# Patient Record
Sex: Female | Born: 1935 | Race: White | Hispanic: No | Smoking: Never smoker
Health system: Southern US, Community
[De-identification: ages and names within clinical notes are randomized; demographics above are authoritative.]

## PROBLEM LIST (undated history)

## (undated) DIAGNOSIS — R296 Repeated falls: Secondary | ICD-10-CM

## (undated) DIAGNOSIS — I10 Essential (primary) hypertension: Secondary | ICD-10-CM

## (undated) DIAGNOSIS — E038 Other specified hypothyroidism: Secondary | ICD-10-CM

## (undated) DIAGNOSIS — D649 Anemia, unspecified: Secondary | ICD-10-CM

## (undated) DIAGNOSIS — E039 Hypothyroidism, unspecified: Secondary | ICD-10-CM

## (undated) HISTORY — PX: JOINT REPLACEMENT: SHX530

---

## 1998-11-18 ENCOUNTER — Encounter: Payer: Self-pay | Admitting: Orthopaedic Surgery

## 1998-11-22 ENCOUNTER — Inpatient Hospital Stay (HOSPITAL_COMMUNITY): Admission: RE | Admit: 1998-11-22 | Discharge: 1998-11-25 | Payer: Self-pay | Admitting: Orthopaedic Surgery

## 1998-11-25 ENCOUNTER — Encounter: Payer: Self-pay | Admitting: Orthopaedic Surgery

## 1998-11-25 ENCOUNTER — Inpatient Hospital Stay (HOSPITAL_COMMUNITY)
Admission: RE | Admit: 1998-11-25 | Discharge: 1998-12-08 | Payer: Self-pay | Admitting: Physical Medicine & Rehabilitation

## 2001-01-04 ENCOUNTER — Emergency Department (HOSPITAL_COMMUNITY): Admission: EM | Admit: 2001-01-04 | Discharge: 2001-01-05 | Payer: Self-pay | Admitting: Physical Therapy

## 2004-07-25 ENCOUNTER — Inpatient Hospital Stay (HOSPITAL_COMMUNITY): Admission: RE | Admit: 2004-07-25 | Discharge: 2004-07-31 | Payer: Self-pay | Admitting: Orthopaedic Surgery

## 2004-07-25 ENCOUNTER — Ambulatory Visit: Payer: Self-pay | Admitting: Physical Medicine & Rehabilitation

## 2004-12-21 ENCOUNTER — Encounter (INDEPENDENT_AMBULATORY_CARE_PROVIDER_SITE_OTHER): Payer: Self-pay | Admitting: Specialist

## 2004-12-21 ENCOUNTER — Ambulatory Visit (HOSPITAL_COMMUNITY): Admission: RE | Admit: 2004-12-21 | Discharge: 2004-12-21 | Payer: Self-pay | Admitting: Gastroenterology

## 2005-03-22 ENCOUNTER — Encounter (INDEPENDENT_AMBULATORY_CARE_PROVIDER_SITE_OTHER): Payer: Self-pay | Admitting: *Deleted

## 2005-03-22 ENCOUNTER — Ambulatory Visit (HOSPITAL_COMMUNITY): Admission: RE | Admit: 2005-03-22 | Discharge: 2005-03-23 | Payer: Self-pay | Admitting: General Surgery

## 2005-07-06 ENCOUNTER — Ambulatory Visit (HOSPITAL_COMMUNITY): Admission: RE | Admit: 2005-07-06 | Discharge: 2005-07-06 | Payer: Self-pay | Admitting: Gastroenterology

## 2006-04-25 ENCOUNTER — Encounter (INDEPENDENT_AMBULATORY_CARE_PROVIDER_SITE_OTHER): Payer: Self-pay | Admitting: *Deleted

## 2006-04-25 ENCOUNTER — Observation Stay (HOSPITAL_COMMUNITY): Admission: EM | Admit: 2006-04-25 | Discharge: 2006-04-28 | Payer: Self-pay | Admitting: Emergency Medicine

## 2007-09-17 ENCOUNTER — Emergency Department (HOSPITAL_COMMUNITY): Admission: EM | Admit: 2007-09-17 | Discharge: 2007-09-17 | Payer: Self-pay | Admitting: Emergency Medicine

## 2010-08-04 NOTE — Discharge Summary (Signed)
[name], [name]                   ACCOUNT NO.:  [id number]   MEDICAL RECORD NO.:  [id number]          PATIENT TYPE:  INP   LOCATION:  4712                         FACILITY:  [facility]   PHYSICIAN:  Hillery Duke, M.D.   DATE OF BIRTH:  [birth year]   DATE OF ADMISSION:  04/25/2006  DATE OF DISCHARGE:  04/28/2006                               DISCHARGE SUMMARY   PRIMARY CARE PHYSICIAN:  [name], M.D., [facility].   DISCHARGE DIAGNOSES:  1. Weakness and failure to thrive.  2. Allergic reaction, possibly to Lasix, with maculopapular rash and      acute tubular necrosis.  3. Acute renal failure secondary to acute tubular necrosis and      possible dehydration in the setting of ongoing ACE inhibitor and      diuretic use  4. Hypertension.  5. Hypokalemia.  6. History of coronary artery disease.   DISCHARGE MEDICATIONS:  1. Ambien 5 mg nightly p.r.n.  2. Carvedilol 3.125 mg b.i.d.  3. Vytorin 10/20 one tablet daily.  4. aspirin 81 mg daily.  5. Prednisone 30 mg on April 29, 2006, then decrease by 10 mg daily      until off.  6. Pepcid 20 mg b.i.d. x1 week.  7. Benadryl 25 mg q.4 h p.r.n. itching or rash.   Note: The patient was instructed to stop furosemide,  lisinopril/hydrochlorothiazide until she follows up with Dr. Andi Duke for  a check of her renal function before considering resumption of these  medications.  Given the temporal relationship of her allergic reaction  to initiation of therapy with furosemide, she likely has a furosemide  allergy and should not be rechallenged with this medication.   CONSULTATIONS:  None.   BRIEF ADMISSION HISTORY OF PRESENT ILLNESS:  The patient is an 77-year-  old female who had recently been seen by her primary care physician and  put on a 10-day course of treatment with Levaquin for a sinus infection.  She then followed up with her primary care physician and, due to a  finding of lower extremity edema, was prescribed Lasix.  She  took the  first dose on April 24, 2006, and subsequently developed a  maculopapular rash diffusely over her lower extremities and trunk within  24 hours.  The rash was pruritic.  Due to complaints of weakness,  decreased appetite, and rash, she presented to the emergency department  and was admitted for further evaluation and workup.  For the full  details of the HPI, please see the dictated report done by Dr. Corky Duke.   PROCEDURES AND DIAGNOSTIC STUDIES:  Chest x-ray done on April 26, 2006, showed no active disease.  There were some chronic lung markings  but no evidence of active infiltrate, mass, effusion or collapse.   DISCHARGE LABORATORY VALUES:  CBC showed a white blood cell count 5.9,  hemoglobin 11.2, hematocrit 31.9, platelets 192.  Sodium was 139,  potassium 4.4, chloride 109, bicarb 23, BUN 22, creatinine 1.2, glucose  132.   HOSPITAL COURSE:  #1.  WEAKNESS WITH FAILURE TO THRIVE:  The patient  likely is  deconditioned from her recent sinusitis given her advanced age.  She was  seen in consultation with physical and occupational therapists who did  recommend ongoing home physical and occupational therapy.  The patient  will be set up prior to discharge.   #2.  ALLERGIC REACTION, LIKELY TO FUROSEMIDE WITH MACULOPAPULAR RASH:  The patient's rash was resolving.  She was put on a combination of a  prednisone taper, Pepcid, and Benadryl p.r.n.Marland Kitchen  She will complete the  taper of prednisone in approximately 3 days.   #3.  ACUTE RENAL FAILURE SECONDARY TO ACUTE TUBULAR NECROSIS, PRESUMED  DUE TO AN ALLERGIC REACTION TO FUROSEMIDE:  The patient was admitted and  put on IV fluid rehydration.  Her furosemide was discontinued as well as  her lisinopril and hydrochlorothiazide.  With IV fluids and holding  these medications, her creatinine improved from a high on admission of  2.06 to the current value of 1.2.  The patient's baseline creatinine is  unknown.  Given this, we will  continue to hold her furosemide, ACE  inhibitor and diuretic.  She should follow up with Dr. Andi Duke early  next week, and he should repeat a renal function profile, and, if her  renal function is back to baseline, consideration can be made for  resuming her lisinopril and hydrochlorothiazide.  Would not rechallenge  with Lasix given her likely allergy to this medication.  GFR has  increased to 43 mL per minute at discharge.   #4.  HYPERTENSION:  The patient's blood pressure has been fairly well  controlled on Coreg therapy alone.   #5.  HYPOKALEMIA:  The patient was hypokalemic on initial presentation.  She was appropriately repleted.   DISPOSITION:  The patient is now stable for discharge home.  She will be  staying with her grandson for several days until her weakness and  deconditioning have been fully addressed by physical and occupational  therapist.  She should follow up with Dr. Andi Duke early next week and  have a basic metabolic profile done to evaluate for resolution of her  renal failure.      Hillery Duke, M.D.  Electronically Signed     CR/MEDQ  D:  04/28/2006  T:  04/28/2006  Job:  [id number]   cc:   [name], M.D.

## 2010-08-04 NOTE — Op Note (Signed)
[name], [name]                   ACCOUNT NO.:  [id number]   MEDICAL RECORD NO.:  [id number]          PATIENT TYPE:  AMB   LOCATION:  DAY                          FACILITY:  [facility]   PHYSICIAN:  Adolph Pollack, M.D.DATE OF BIRTH:  04/23/1917   DATE OF PROCEDURE:  03/22/2005  DATE OF DISCHARGE:  12/21/2004                                 OPERATIVE REPORT   PREOP DIAGNOSIS:  Large tubulovillous adenoma of the rectum.   POSTOPERATIVE DIAGNOSIS:  Large tubulovillous adenoma of the rectum.   PROCEDURE:  Transanal excision of tubulovillous adenoma of the rectum.   SURGEON:  Adolph Pollack, M.D.   ANESTHESIA:  General.   INDICATIONS:  [name] is an 77 year old female who underwent colonoscopy by  Dr. Elnoria Howard and was found to have a large adenomatous polyp approximately at 8  cm from the anal verge. This was unable to be removed colonoscopically  because of the thick stalk. Biopsy demonstrated tubulovillous adenoma. On  examination, you could barely feel it by way of the tip of the finger. She  now presents for transanal excision. We discussed the procedure and risks  preoperatively.   TECHNIQUE:  She is seen in holding area and brought to the operating room,  placed supine on the operating table. General anesthetic was administered.  She was then carefully placed in the lithotomy position. The perianal area  was sterilely prepped and draped. Digital rectal exam was performed. The  mass was able to be palpated.  I used a 15 cm proctoscope to visualize the  mass; however, the mass was so large it filled the entire lumen with the  proctoscope. I was able to get my fingers around the mass and retract it. I  then provided anterior retraction by way of a Deaver retractor and could  expose the stalk of the polyp. Using the harmonic scalpel,  I then divided  this stalk and excised the polyp. Multiple fragments were also removed  because of some of the traction trauma to the polyp. These  were all placed  in a sterile cup and sent to pathology.   Following this, I inspected the area and there is some bleeding. I  subsequently used and interrupted 2-0 Vicryl suture for hemostasis purposes.  This provided for adequate hemostasis. Following this, I then inserted the  proctosigmoidoscope and went beyond the area of the biopsy and came back  down. Hemostasis was adequate. I then put a large piece of Gelfoam over the  biopsy bed. A bulky dressing was applied.   She tolerated the procedure without any apparent complications. She  subsequent was taken to recovery room in satisfactory condition.      Adolph Pollack, M.D.  Electronically Signed     TJR/MEDQ  D:  03/22/2005  T:  03/22/2005  Job:  [id number]   cc:   Jordan Hawks. Elnoria Howard, MD  Fax: [phone]   Gabriel Earing, M.D.  Fax: [phone]

## 2010-08-04 NOTE — H&P (Signed)
[name], [name]                   ACCOUNT NO.:  [id number]   MEDICAL RECORD NO.:  [id number]          PATIENT TYPE:  EMS   LOCATION:  MAJO                         FACILITY:  [facility]   PHYSICIAN:  Mobolaji B. Bakare, M.D.DATE OF BIRTH:  May 20, 1917   DATE OF ADMISSION:  04/25/2006  DATE OF DISCHARGE:                              HISTORY & PHYSICAL   PRIMARY CARE PHYSICIAN:  Dr. Andi Devon at [facility].   CHIEF COMPLAINT:  Weakness and decreased appetite, rash.   HISTORY OF PRESENT ILLNESS:  [name] is an 77 year old Caucasian female  who developed upper respiratory tract infection with severe sinusitis  about 2 weeks ago.  She was seen at [facility] and started on Levaquin  which she completed for 10 days.  The patient apparently was in contact  with daycare kids and there was upper respiratory tract infection among  the children.  She got better from upper respiratory tract infection  symptoms, but she continued to feel unwell and she has reduced appetite  and she has not been able to eat.  She went back to [employer] 4 days  ago.  The patient had a chest x-ray and some blood work.  She was told  that the chest x-ray was fine and the patient also complained of lower  extremity edema.  She was prescribed Lasix.  She used the Lasix first  dose yesterday.  Subsequently, son who has been the primary caregiver  noticed rash on her lower extremities today.  Rash is associated with  itching and rash has extended to the trunk as well.  There is no fever  or chills.  The patient has nausea but no vomiting.  No diarrhea or  constipation.   REVIEW OF SYSTEMS:  She has headaches, but no change in her vision.  The  patient's appetite is poor.  She denies shortness of breath, but she  coughs and cough is productive of sputum.  No dysuria.  She has stress  incontinence.   PAST MEDICAL HISTORY:  1. Coronary artery disease.  This is unclear, nevertheless, she sees      Vonna Kotyk R. Jacinto Halim, M.D.  as cardiologist.  2. Left total knee replacement.  3. Right hip replacement.   CURRENT MEDICATIONS:  1. Ambien 5 mg q.h.s.  2. Furosemide 20 mg daily which she started yesterday.  3. Lisinopril hydrochlorothiazide 20/12.5 daily.  4. Carvedilol 3.125 b.i.d.  5. Vytorin 10/20 daily.  6. Aspirin 81 mg daily.   ALLERGIES:  PENICILLIN.   SOCIAL HISTORY:  The patient lives independently.  She does not smoke  cigarettes or drink alcohol.   FAMILY HISTORY:  Both parents are deceased from heart trouble.   PHYSICAL EXAMINATION:  VITAL SIGNS:  Temperature 97.1, blood pressure  117/67, pulse 72, respiratory rate 18, O2 saturations 97%.  GENERAL:  The patient is awake, alert, and oriented to time, place, and  person.  HEENT:  Normocephalic and atraumatic head.  Pupils equal, round, and  reactive to light.  Extraocular movements intact.  No elevated JVD.  Mucous membranes dry.  LUNGS:  Bibasilar  rales, no wheeze, and minimal rhonchi.  HEART:  S1 and S2 regular.  ABDOMEN:  Nondistended.  Soft and nontender.  Bowel sounds present.  No  palpable organomegaly.  EXTREMITIES:  Trace ankle edema.  No calf tenderness.  SKIN:  Extensive macular papular rash extending from the feet involving  lower extremities bilaterally, anterior and posterior trunk.  There is  also rash which appears different over the malar region.  NEUROLOGY:  No focal neurological deficit.   LABORATORY DATA:  White count 6.3, hemoglobin 12.5, hematocrit 36.2,  platelets 222, and neutrophils 72%, lymphocytes 15%, eosinophils 3%,  absolute eosinophil count is 0.2 which is within normal limits.  Sodium  136, potassium 3.6, chloride 101, CO2 26, glucose 86, BUN 38, creatinine  2.06, alkaline phosphatase 76, bilirubin 1.0, AST 20, ALT 15, total  protein 6.6, albumin 3.3, calcium 9.9, PT/INR 16.3/1.3.   EKG showed normal sinus rhythm with sinus arrhythmia, normal intervals.   Chest x-ray shows no acute cardiopulmonary  disease.   ASSESSMENT:  [name] is an 77 year old Caucasian female presenting with  recent treatment for urinary tract infection and sinusitis.  She  subsequently developed maculopapular rash and she is noted to have renal  insufficiency on laboratory data.  She is weak.   ADMISSION DIAGNOSIS:  1. Weakness secondary to poor intake and recent infection.  2. Maculopapular rash, extensive.  Probably allergy to antibiotic      (Levaquin versus Lasix).  3. Renal insufficiency.  There is no old BUN and creatinine to      compare.   PLAN:  We will admit to telemetry, cycle cardiac panel q.8 hours x3, and  IV fluid normal saline at 75 mL/hour, Benadryl 12.5 mg q.6 hours p.o.  for 48 hours, Pepcid 20 mg p.o. b.i.d., prednisone 40 mg p.o. daily for  5 days.  I will check fractional excretion of sodium, urinalysis, and  microscopic urine, and eosinophils.  If renal function is not improving,  we will obtain renal ultrasound.  I am holding Lisinopril and  hydrochlorothiazide, also holding Lasix.  We will obtain baseline labs  from Dr. Augustin Schooling office in a.m.      Mobolaji B. Corky Downs, M.D.  Electronically Signed     MBB/MEDQ  D:  04/25/2006  T:  04/25/2006  Job:  [id number]   cc:   Gabriel Earing, M.D.

## 2010-08-04 NOTE — Op Note (Signed)
[name], [name]                   ACCOUNT NO.:  [id number]   MEDICAL RECORD NO.:  [id number]          PATIENT TYPE:  INP   LOCATION:  2899                         FACILITY:  [facility]   PHYSICIAN:  Lubertha Basque. Dalldorf, M.D.DATE OF BIRTH:  January 30, 1918   DATE OF PROCEDURE:  07/25/2004  DATE OF DISCHARGE:                                 OPERATIVE REPORT   PREOPERATIVE DIAGNOSIS:  Right hip degenerative arthritis.   POSTOPERATIVE DIAGNOSIS:  Right hip degenerative arthritis.   PROCEDURE:  Right total hip replacement.   ANESTHESIA:  General.   ATTENDING SURGEON:  Lubertha Basque. Jerl Santos, M.D.   ASSISTANT:  Lindwood Qua, P.A.   INDICATIONS FOR PROCEDURE:  The patient is an 77 year old woman with a long  history of right hip pain. This has persisted despite many different oral  anti-inflammatories. She is ambulatory with a cane and walker and is having  difficulty doing so. She has trouble resting at night. She is offered a hip  replacement. Informed operative consent was obtained after discussion of  possible complications of reaction to anesthesia, infection, DVT, PE,  dislocation, and death.   DESCRIPTION OF PROCEDURE:  The patient is taken to the operating suite where  general anesthetic is applied without difficulty. She was positioned in the  lateral decubitus position with the right hip up. An axillary roll was  placed and all bony prominences were appropriately padded. Hip positioners  were also utilized. She was then prepped and draped in normal sterile  fashion. After administration of preoperative IV antibiotics, a  posterior  approach to the right hip was taken through an abundance of adipose tissue.  All appropriate anti-infective measures were used including closed hooded  exhaust systems for each member the surgical team, Betadine impregnated  drape, and preoperative IV antibiotic. We dissected through about four  inches of adipose tissue to expose the IT band of the hip.  This was then  incised longitudinally along with the fascia of the gluteus maximus to  expose the short external rotators of the hip which were tagged and  reflected. With some moderate difficulty, we released the posterior capsule  and the hip was dislocated. She had a very short femoral neck consistent  with some significant collapse of the head and osteophyte formation. The  femoral neck cut was made just above the lesser trochanter. Femoral head was  removed. This was oval in shape and no longer round. The bone quality of the  acetabulum was good. Some soft tissues including labrum anterior-posterior  was cleared from the acetabulum. Reaming was taken to the medial wall and  then sequentially brought up to size 57 followed by placement of a 58  pinnacle DePuy porous coated cup with one central hole. This was seated well  in appropriate anteversion and tilt. We used the hole eliminator and then a  10 degree [place] lipped liner. Attention was then turned toward the femur.  This was exposed appropriately and reamed and broached up to a size 4. We  decided to place a cemented component in this 77 year old woman with fair to  good bone quality.  A cement spacer was placed distally which was size 3  followed by thorough irrigation of the femoral canal with pulsatile lavage.  This was cleansed thoroughly followed by mixing of cement with antibiotic.  This cement was then pressurized into the femoral stem followed by  introduction of a size 4 high offset [place] cemented stem by DePuy. This was  placed in some anteversion of about 5 or 10 degrees. Excess cement was  trimmed and pressure was held on the component until the cement had  hardened. This was then topped with a +9 32 mm hip ball. The hip reduced  well and was stable in extension with external rotation as well as flexion  with internal rotation. Her leg lengths were judged to be roughly equal. The  wound was irrigated followed by  reapproximation of the short external  rotators to the greater trochanteric region with nonabsorbable suture. IT  band was reapproximated with #1 Vicryl in interrupted fashion. We then  reapproximated the four inches of adipose tissue in three layers with  various sizes of Vicryl. Skin was closed with staples. Some Adaptic was  applied followed by dry gauze and tape. The case was 20 or 30 minutes longer  than normal due to the size of the patient and the thickness of the adipose  layer. Estimated blood loss and intraoperative fluids can be obtained for  anesthesia records.   DISPOSITION:  The patient was extubated in the operating room and taken to  the recovery room in stable addition. Plans were for her to be admitted  orthopedic surgery service for appropriate postop care to include  perioperative antibiotics and Coumadin plus Lovenox for DVT prophylaxis.      PGD/MEDQ  D:  07/25/2004  T:  07/25/2004  Job:  [id number]

## 2010-08-04 NOTE — Discharge Summary (Signed)
[name], [name]                   ACCOUNT NO.:  [id number]   MEDICAL RECORD NO.:  [id number]          PATIENT TYPE:  INP   LOCATION:  5019                         FACILITY:  [facility]   PHYSICIAN:  Lubertha Basque. Dalldorf, M.D.DATE OF BIRTH:  Mar 31, 1917   DATE OF ADMISSION:  07/25/2004  DATE OF DISCHARGE:  07/31/2004                                 DISCHARGE SUMMARY   ADMISSION DIAGNOSES:  1.  Right hip degenerative joint disease.  2.  History of long standing incontinence, intermittent.   DISCHARGE DIAGNOSES:  1.  Right hip degenerative joint disease.  2.  History of long standing incontinence, intermittent.  3.  Coumadin deep vein thrombosis prophylaxis.  4.  Blood loss anemia.   BRIEF HISTORY:  [name] is an 77 year old white female who lives by her  self but has help by a nephew part time at her home.  She is a patient well  known to our practice.  Lives along, independent ambulator.  We have seen  her before for a knee replacement, and she had done well, and now her hip is  very bothersome on the right side.  X-rays show end stage DJD, and she is  having pain with every step and night pain.  I discussed treatment/option  with her, that being a right total knee replacement.  I talked to her about  the risks of anesthesia, DVT and possible death.   LABORATORY AND X-RAY FINDINGS:  Last INR 3.6.  Hemoglobin 9.9, hematocrit  29.2.  Glucose 114, sodium 131, potassium 3.9, creatinine 0.9, BUN 12.  No  active disease on chest x-ray.  Cardiology normal sinus rhythm.   HOSPITAL COURSE:  [name] was admitted postop.  An IV of lactated Ringers,  PCA morphine pump, 1 g of Ancef q.8h. x3 doses, Colace, Trinsicon, laxative  of choice or enema, Coumadin and Lovenox DVT prophylaxis protocol along with  Percocet for pain.  Robaxin is a muscle relaxer and Benadryl to help her  rest.  She was on no home medications.  Ice pack to her right hip.  Incentive spirometry q.1h.  She had a Foley catheter  for two days and then  discontinued.  Bed with an overhead frame.  Knee immobilizer on at nighttime  while resting.  Could be weightbearing as tolerated, CBC, B-met and pro  times as described above.  She did have Autovac drain that was discontinued  postop day #2.  On her postop day #1, her course in hospital blood pressure  was 156/84, heart rate 107, temperature 100.  She was extremely drowsy.  Lungs were clear.  Abdomen was soft.  There was no sign of hip infection or  drainage on the right side.  She had good neurovascular status to her toes.  Foley catheter was in place.  She was using an IV PCA pump.  Hemoglobin 11.9  and INR 1.4.  Physical therapy was ordered for out of bed weightbearing as  tolerated.  Pharmacy helped regulate her Coumadin/Lovenox protocols.  Second  day postop blood pressure remained normal at 137/67, temperature 100.3,  hematocrit 10.1, INR 2.0.  Lungs clear.  Abdomen soft  BUN benign.  Drain  and dressing changed.  No sign of infection or irritation.  Good  neurovascular status to her toes.  Her Foley was discontinued.  Her IV's  were discontinued.  Her PCA was discontinued as well.  She progressed with  physical therapy slowly.  Was very drowsy from her pain medication, and due  to the fact that she lived alone, it was felt that it would be necessary to  have consultations from Rehab and SACU units and they proposed that she be  discharged to an available bed at [facility] skilled nursing facility, and  she was discharged.   CONDITION ON DISCHARGE:  Improved.   DISCHARGE MEDICATIONS:  1  Coumadin protocol for DVT prophylaxis per  pharmacy.  1.  Trinsicon, one capsule per day by mouth.  2.  Senokot tablet as needed p.r.n. constipation.  3.  Enema p.r.n. constipation.  4.  Tylox or Percocet 1-2 q.4-6h. p.r.n. pain.  6  Tylenol 1-2 for temperature elevations above 100.5.  1.  Robaxin 500 mg one p.o. q.8h. p.r.n. spasm.   DISCHARGE INSTRUCTIONS:  1.  Diet:   Unrestricted.  2.  Activities:  Weight bearing as tolerated with help of physical therapy.  3  Wound Care:  May change dressing and dry gauze and tape on a daily basis.  Staples will be removed two weeks from the operative date.   FOLLOWUP:  Follow up with [place] in our office either at the two week mark if  she is still in skilled nursing facility then the one month mark at [phone]  [phone].      MC/MEDQ  D:  07/31/2004  T:  07/31/2004  Job:  [id number]

## 2010-12-14 LAB — BASIC METABOLIC PANEL
BUN: 12
CO2: 26
Chloride: 109
Creatinine, Ser: 1.15
GFR calc Af Amer: 54 — ABNORMAL LOW
Glucose, Bld: 117 — ABNORMAL HIGH

## 2010-12-14 LAB — CBC
MCHC: 34
MCV: 88.2
RBC: 4.56
RDW: 13.4

## 2010-12-14 LAB — DIFFERENTIAL
Basophils Relative: 1
Eosinophils Absolute: 0.2
Monocytes Relative: 9
Neutrophils Relative %: 56

## 2010-12-14 LAB — POCT CARDIAC MARKERS: Operator id: 257131

## 2011-08-05 ENCOUNTER — Ambulatory Visit (INDEPENDENT_AMBULATORY_CARE_PROVIDER_SITE_OTHER): Payer: Medicare Other | Admitting: Family Medicine

## 2011-08-05 VITALS — BP 191/72 | HR 69 | Temp 97.3°F | Resp 18 | Ht 60.5 in | Wt 145.0 lb

## 2011-08-05 DIAGNOSIS — I10 Essential (primary) hypertension: Secondary | ICD-10-CM

## 2011-08-05 DIAGNOSIS — W19XXXA Unspecified fall, initial encounter: Secondary | ICD-10-CM

## 2011-08-05 DIAGNOSIS — L988 Other specified disorders of the skin and subcutaneous tissue: Secondary | ICD-10-CM

## 2011-08-05 DIAGNOSIS — R32 Unspecified urinary incontinence: Secondary | ICD-10-CM

## 2011-08-05 DIAGNOSIS — L989 Disorder of the skin and subcutaneous tissue, unspecified: Secondary | ICD-10-CM

## 2011-08-05 LAB — POCT URINALYSIS DIPSTICK
Nitrite, UA: NEGATIVE
Urobilinogen, UA: 0.2
pH, UA: 7

## 2011-08-05 LAB — POCT CBC
HCT, POC: 40.4 % (ref 37.7–47.9)
Lymph, poc: 2.7 (ref 0.6–3.4)
MCHC: 31.9 g/dL (ref 31.8–35.4)
MCV: 87.7 fL (ref 80–97)
POC Granulocyte: 3.6 (ref 2–6.9)
POC LYMPH PERCENT: 39.2 %L (ref 10–50)
RDW, POC: 14 %

## 2011-08-05 LAB — POCT UA - MICROSCOPIC ONLY
Casts, Ur, LPF, POC: NEGATIVE
Crystals, Ur, HPF, POC: NEGATIVE
Yeast, UA: NEGATIVE

## 2011-08-05 NOTE — Progress Notes (Signed)
Subjective:    Patient ID: [name], female    DOB: [birth year], 78 y.o.   MRN: [id number]  HPI 78 yo female here with falls.  Legs swelling and falls for about 3 months.  Malaise.  Used to go to [facility] [address] but has closed down.   Lives alone.  Brought here by granddaughter-in-law.  Granddaughter states she is calling saying she fell maybe 10 times in last 3-4 months.  Patient stating only twice.  Does tend to be if she trips on something on the floor or on uneven ground.  No injuries from falls. S/p left knee replacement and right hip replacement - 6 yrs ago approx knee, 8 hip. Granddaughter has some concern about memory/mind. Endorses incontinence for several years, worsening recently. Also constipation and then when she does need to go she has to go right away or she won't hold it.  States falls always happened when she didn't have her cane with her.  States doesn't fall if she has her cane.   Has been at least 2 years since she was at the doctor.  Was referred to cards and had thyroid checked.  Put on "tons of meds" and patient took herself off them after about 6 months stating they made her feel badly.    Review of Systems Negative except as per HPI     Objective:   Physical Exam  Constitutional: Vital signs are normal. She appears well-developed and well-nourished. She is active and cooperative.  HENT:  Head: Normocephalic.  Right Ear: Hearing, tympanic membrane, external ear and ear canal normal.  Left Ear: Hearing, tympanic membrane, external ear and ear canal normal.  Nose: Nose normal.  Mouth/Throat: Uvula is midline, oropharynx is clear and moist and mucous membranes are normal.  Eyes: Conjunctivae, EOM and lids are normal. Pupils are equal, round, and reactive to light. Right eye exhibits no nystagmus. Left eye exhibits no nystagmus.  Cardiovascular: Normal rate, regular rhythm, normal heart sounds, intact distal pulses and normal pulses.     Pulmonary/Chest: Effort normal and breath sounds normal.  Neurological: She is alert. She has normal strength. No cranial nerve deficit. She displays a negative Romberg sign. Coordination and gait normal.       Speech is somewhat difficult to understand but patient with poor dentition and granddaughter confirms that is her normal speech.  No marked weakness.  Slow, hunched gait with cane.  Attempted romberg but patient too scared to fully cooperate.   Skin:       Left posterior shoulder - 1 cm dark, hard lesion under thin layer of skin.  Hard. Non tender.  Reportedly removed multiple times before. Came back   Results for orders placed in visit on 08/05/11  POCT CBC      Component Value Range   WBC 6.8  4.6 - 10.2 (K/uL)   Lymph, poc 2.7  0.6 - 3.4    POC LYMPH PERCENT 39.2  10 - 50 (%L)   MID (cbc) 0.5  0 - 0.9    POC MID % 7.3  0 - 12 (%M)   POC Granulocyte 3.6  2 - 6.9    Granulocyte percent 53.5  37 - 80 (%G)   RBC 4.61  4.04 - 5.48 (M/uL)   Hemoglobin 12.9  12.2 - 16.2 (g/dL)   HCT, POC 40.9  81.1 - 47.9 (%)   MCV 87.7  80 - 97 (fL)   MCH, POC 28.0  27 - 31.2 (  pg)   MCHC 31.9  31.8 - 35.4 (g/dL)   RDW, POC 16.1     Platelet Count, POC 339  142 - 424 (K/uL)   MPV 9.0  0 - 99.8 (fL)  POCT UA - MICROSCOPIC ONLY      Component Value Range   WBC, Ur, HPF, POC 1-4     RBC, urine, microscopic neg     Bacteria, U Microscopic trace     Mucus, UA neg     Epithelial cells, urine per micros 0-2     Crystals, Ur, HPF, POC neg     Casts, Ur, LPF, POC neg     Yeast, UA neg    POCT URINALYSIS DIPSTICK      Component Value Range   Color, UA yellow     Clarity, UA clear     Glucose, UA neg     Bilirubin, UA neg     Ketones, UA neg     Spec Grav, UA 1.015     Blood, UA neg     pH, UA 7.0     Protein, UA trace     Urobilinogen, UA 0.2     Nitrite, UA neg     Leukocytes, UA Trace    GLUCOSE, POCT (MANUAL RESULT ENTRY)      Component Value Range   POC Glucose 82  70 - 99 (mg/dl)           Assessment & Plan:  Falls, incontinence, ? Dementia -  Labs today normal. TSH, B12, CMET still pending. DDX: hypothyroid, B12 deficiency, kidney failure, NPH, dementia Await labs.  Treat as indicated.  Consider neuro referral. Following up with me on Thursday to discuss lab results and further tx.  Consider doing MMSE at that visit.   For skin lesion - refer to derm.

## 2011-08-07 LAB — COMPREHENSIVE METABOLIC PANEL
AST: 14 U/L (ref 0–37)
Alkaline Phosphatase: 124 U/L — ABNORMAL HIGH (ref 39–117)
BUN: 23 mg/dL (ref 6–23)
Creat: 1.3 mg/dL — ABNORMAL HIGH (ref 0.50–1.10)

## 2011-08-07 LAB — VITAMIN B12: Vitamin B-12: 339 pg/mL (ref 211–911)

## 2011-08-07 LAB — VITAMIN D 25 HYDROXY (VIT D DEFICIENCY, FRACTURES): Vit D, 25-Hydroxy: 18 ng/mL — ABNORMAL LOW (ref 30–89)

## 2011-08-09 ENCOUNTER — Ambulatory Visit (INDEPENDENT_AMBULATORY_CARE_PROVIDER_SITE_OTHER): Payer: Medicare Other | Admitting: Family Medicine

## 2011-08-09 VITALS — BP 190/90 | HR 72 | Temp 97.7°F | Resp 16 | Ht 59.75 in | Wt 145.8 lb

## 2011-08-09 DIAGNOSIS — I10 Essential (primary) hypertension: Secondary | ICD-10-CM

## 2011-08-09 DIAGNOSIS — W19XXXA Unspecified fall, initial encounter: Secondary | ICD-10-CM

## 2011-08-09 DIAGNOSIS — R269 Unspecified abnormalities of gait and mobility: Secondary | ICD-10-CM

## 2011-08-09 MED ORDER — ERGOCALCIFEROL 1.25 MG (50000 UT) PO CAPS: 50000.0000 [IU] | ORAL_CAPSULE | ORAL | Status: AC

## 2011-08-09 MED ORDER — LISINOPRIL 20 MG PO TABS: 20.0000 mg | ORAL_TABLET | Freq: Every day | ORAL | Status: AC

## 2011-08-09 MED ORDER — AMLODIPINE BESYLATE 10 MG PO TABS: 10.0000 mg | ORAL_TABLET | Freq: Every day | ORAL | Status: AC

## 2011-08-09 NOTE — Progress Notes (Signed)
  Subjective:    Patient ID: [name], female    DOB: March 01, 1918, 78 y.o.   MRN: [id number]  HPI 78 yo female here to follow-up visit from weekend for falls/weakness. Did bring old meds - was on 4 BP meds (coreg, norvasc, lisinopril-hct), pravastatin.   No falls since seen.  See last OV regarding falls. Labs all normal except for vitamin D, which is low, and can contribute to weakness and falls in the elderly    Review of Systems Negative except as per HPI     Objective:   Physical Exam  Constitutional: She appears well-developed and well-nourished.  Cardiovascular: Normal rate, regular rhythm, normal heart sounds and intact distal pulses.   No murmur heard. Pulmonary/Chest: Effort normal and breath sounds normal.  Neurological: She is alert.  Skin: Skin is warm and dry.          Assessment & Plan:  Falls HTN  Question NPH or vascualr dementia =/- age and deconditioning for falls.  Will replete Vit D Restart norvasc and lisinopril.  Avoid HCT to not cause her to hurry to urinate and possibly fall. Neuro referral as family would like to pursue.

## 2011-08-12 ENCOUNTER — Telehealth: Payer: Self-pay

## 2011-08-12 NOTE — Telephone Encounter (Signed)
Pt's Granddaughter Belenda Cruise) is calling to ask which BP Rx she was given when she was here on 08/09/11.  Belenda Cruise states that Dr Georgiana Shore told them to start one BP medicine and then five days later start the other, but she can not remember which on she is suppose to start with.

## 2011-08-12 NOTE — Telephone Encounter (Signed)
Spoke with pt gave instructions for her meds. Pt understood

## 2011-08-12 NOTE — Telephone Encounter (Signed)
Restart lisinopril first, then norvasc

## 2011-08-23 ENCOUNTER — Ambulatory Visit: Payer: Medicare Other | Admitting: Family Medicine

## 2011-08-30 ENCOUNTER — Ambulatory Visit (INDEPENDENT_AMBULATORY_CARE_PROVIDER_SITE_OTHER): Payer: Medicare Other | Admitting: Family Medicine

## 2011-08-30 ENCOUNTER — Encounter: Payer: Self-pay | Admitting: Family Medicine

## 2011-08-30 VITALS — BP 121/68 | HR 67 | Temp 96.8°F | Resp 18 | Ht 59.75 in | Wt 144.8 lb

## 2011-08-30 DIAGNOSIS — Z602 Problems related to living alone: Secondary | ICD-10-CM

## 2011-08-30 DIAGNOSIS — I10 Essential (primary) hypertension: Secondary | ICD-10-CM

## 2011-08-30 DIAGNOSIS — I499 Cardiac arrhythmia, unspecified: Secondary | ICD-10-CM

## 2011-08-30 NOTE — Progress Notes (Signed)
  Subjective:    Patient ID: [name], female    DOB: 01-29-1918, 78 y.o.   MRN: [id number]  HPI  Pt is here with her granddaughter-in-law for follow-up after evaluation at [facility] for falls; she is  awaiting a referral to Neurologist (possible early dementia). Pt ambulates with a cane; has a walker at  home but gets around better with cane. States she has been falling for a long time; she lives alone.  Diet and hydration are fair at best. She may eat 1 good meal a day with a snack ("I just don't have an   appetite") Medication changes were addressed at last visit and pt is taking the 2 meds as Dr. Georgiana Shore advised.   She is a poor historian and is unable to engage in conversation to provide much information. She is  here with her Granddaughter-in-law (also 4 children accompanied pt to this visit).    Review of Systems  Constitutional: Positive for activity change and appetite change. Negative for fever, diaphoresis and fatigue.  Respiratory: Negative.   Cardiovascular: Negative.   Musculoskeletal: Positive for gait problem.  Skin: Negative.   Neurological: Positive for speech difficulty and weakness. Negative for syncope, facial asymmetry, light-headedness and numbness.  Psychiatric/Behavioral: Positive for confusion.       Objective:   Physical Exam  Nursing note and vitals reviewed. Constitutional: No distress.       Appears as frail elderly woman  HENT:  Head: Normocephalic and atraumatic.       Dentition is poor  Eyes: Conjunctivae and EOM are normal. No scleral icterus.  Neck: No thyromegaly present.  Cardiovascular: Normal rate, regular rhythm and normal heart sounds.  Exam reveals no gallop.   No murmur heard. Pulmonary/Chest: Effort normal and breath sounds normal. No respiratory distress. She has no wheezes. She has no rales.  Abdominal: Soft. Bowel sounds are normal. She exhibits no mass. There is no tenderness. There is no guarding.  Musculoskeletal: She exhibits no  edema and no tenderness.       Diffuse degenerative changes in all major joints and in digits  Lymphadenopathy:    She has no cervical adenopathy.  Neurological: She is alert. No cranial nerve deficit. Coordination normal.       Oriented to person (and perhaps place)  Skin: Skin is warm and dry.  Psychiatric: She has a normal mood and affect. Her behavior is normal.       Pt seems mildly confused     ECG:  NSR; no ectopy     Assessment & Plan:   1. Irregular heart rate  EKG 12-Lead Vitamin D supplementation (recheck in 12 weeks)  2. HTN (hypertension), benign - well controlled on current medications   3. Elderly person living alone  Neurology referral is pending

## 2011-09-02 DIAGNOSIS — E559 Vitamin D deficiency, unspecified: Secondary | ICD-10-CM | POA: Insufficient documentation

## 2011-09-02 DIAGNOSIS — I10 Essential (primary) hypertension: Secondary | ICD-10-CM | POA: Insufficient documentation

## 2011-09-02 DIAGNOSIS — Z602 Problems related to living alone: Secondary | ICD-10-CM | POA: Insufficient documentation

## 2011-10-12 ENCOUNTER — Ambulatory Visit: Payer: Self-pay | Admitting: Neurology

## 2012-01-02 ENCOUNTER — Encounter: Payer: Self-pay | Admitting: *Deleted

## 2012-01-02 DIAGNOSIS — L989 Disorder of the skin and subcutaneous tissue, unspecified: Secondary | ICD-10-CM

## 2013-08-25 ENCOUNTER — Other Ambulatory Visit: Payer: Self-pay | Admitting: Family Medicine

## 2013-08-25 ENCOUNTER — Ambulatory Visit (INDEPENDENT_AMBULATORY_CARE_PROVIDER_SITE_OTHER): Payer: Medicare Other | Admitting: Family Medicine

## 2013-08-25 VITALS — BP 144/82 | HR 75 | Temp 97.8°F | Resp 18 | Ht 59.75 in | Wt 124.2 lb

## 2013-08-25 DIAGNOSIS — R42 Dizziness and giddiness: Secondary | ICD-10-CM

## 2013-08-25 DIAGNOSIS — D509 Iron deficiency anemia, unspecified: Secondary | ICD-10-CM

## 2013-08-25 DIAGNOSIS — E038 Other specified hypothyroidism: Secondary | ICD-10-CM

## 2013-08-25 DIAGNOSIS — R32 Unspecified urinary incontinence: Secondary | ICD-10-CM

## 2013-08-25 DIAGNOSIS — R296 Repeated falls: Secondary | ICD-10-CM

## 2013-08-25 DIAGNOSIS — R5381 Other malaise: Secondary | ICD-10-CM

## 2013-08-25 DIAGNOSIS — E559 Vitamin D deficiency, unspecified: Secondary | ICD-10-CM

## 2013-08-25 DIAGNOSIS — Z602 Problems related to living alone: Secondary | ICD-10-CM

## 2013-08-25 DIAGNOSIS — E039 Hypothyroidism, unspecified: Secondary | ICD-10-CM

## 2013-08-25 DIAGNOSIS — Z9181 History of falling: Secondary | ICD-10-CM

## 2013-08-25 DIAGNOSIS — R630 Anorexia: Secondary | ICD-10-CM

## 2013-08-25 LAB — POCT URINALYSIS DIPSTICK
BILIRUBIN UA: NEGATIVE
GLUCOSE UA: NEGATIVE
Ketones, UA: NEGATIVE
NITRITE UA: POSITIVE
Protein, UA: 30
Spec Grav, UA: 1.02
Urobilinogen, UA: 0.2
pH, UA: 5.5

## 2013-08-25 LAB — COMPREHENSIVE METABOLIC PANEL
ALBUMIN: 3.8 g/dL (ref 3.5–5.2)
AST: 13 U/L (ref 0–37)
Alkaline Phosphatase: 110 U/L (ref 39–117)
BUN: 18 mg/dL (ref 6–23)
CALCIUM: 10.3 mg/dL (ref 8.4–10.5)
CHLORIDE: 103 meq/L (ref 96–112)
CO2: 24 mEq/L (ref 19–32)
Creat: 1.2 mg/dL — ABNORMAL HIGH (ref 0.50–1.10)
Glucose, Bld: 86 mg/dL (ref 70–99)
POTASSIUM: 4.4 meq/L (ref 3.5–5.3)
SODIUM: 136 meq/L (ref 135–145)
TOTAL PROTEIN: 7.2 g/dL (ref 6.0–8.3)
Total Bilirubin: 0.4 mg/dL (ref 0.2–1.2)

## 2013-08-25 LAB — POCT UA - MICROSCOPIC ONLY
Casts, Ur, LPF, POC: NEGATIVE
Crystals, Ur, HPF, POC: NEGATIVE
MUCUS UA: POSITIVE
YEAST UA: NEGATIVE

## 2013-08-25 LAB — CBC
HCT: 33.8 % — ABNORMAL LOW (ref 36.0–46.0)
Hemoglobin: 11.3 g/dL — ABNORMAL LOW (ref 12.0–15.0)
MCH: 26.6 pg (ref 26.0–34.0)
MCHC: 33.4 g/dL (ref 30.0–36.0)
MCV: 79.5 fL (ref 78.0–100.0)
PLATELETS: 411 10*3/uL — AB (ref 150–400)
RBC: 4.25 MIL/uL (ref 3.87–5.11)
RDW: 14.3 % (ref 11.5–15.5)
WBC: 9.1 10*3/uL (ref 4.0–10.5)

## 2013-08-25 NOTE — Progress Notes (Addendum)
Subjective:   This chart was scribed for Norberto Sorenson, MD, by Yevette Edwards, ED Scribe. This patient's care was started at 3:48 PM.    Patient ID: [name], female    DOB: 07-Jun-1917, 80 y.o.   MRN: [id number]  Chief Complaint  Patient presents with  . Difficulty Walking    having prob with walking x 3 weeks    HPI  HPI Comments: [name] is a 80 y.o. female who presents to [facility] complaining of increased difficulty ambulating. She denies pain to her lower extremities. She experiences baseline swelling to her lower extremities; the pt reports the swelling makes her feel "heavy" and she has difficulty walking. She also falls because she is off-balance; she has not fallen recently, but she is concerned that she will fall. She also endorses dizziness.   The pt lives independently at home. She uses a cane and walker for ambulation; she states she uses the walker more often now. Her previous falls were associated with using a cane.  She has neighbors who check on her, and her grandson checks on her daily.  She has not engaged in physical therapy. She used to wear compression hose, but she does not anymore as they are painful.   [name] has a h/o Lt hip surgery and Rt knee surgery performed by Dr. Yisroel Ramming at [facility]. 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 He gave her cortisone IM which improved the pain for several months, but now the knee pain is beginning to return.   [name] also reports a decreased appetite. Her grandson states she is improved with Ensure, but she does not drink it regularly. The pt states she goes 7-8 hours without eating. She had a slice of cake for breakfast this morning.   Additionally, she reports episodes of urinary and bowel incontinence.   Her grandson reports a cyst to her left shoulder which itches. The pt had the cyst removed twice in the past. She  endorses itching associated with the cyst, but she denies pain. [name] does not have a dermatologist.    History reviewed. No pertinent past medical history.  Current Outpatient Prescriptions on File Prior to Visit  Medication Sig Dispense Refill  . amLODipine (NORVASC) 10 MG tablet Take 1 tablet (10 mg total) by mouth daily.  90 tablet  3  . lisinopril (PRINIVIL,ZESTRIL) 20 MG tablet Take 1 tablet (20 mg total) by mouth daily.  90 tablet  3   No current facility-administered medications on file prior to visit.   Allergies  Allergen Reactions  . Penicillins     Review of Systems  Constitutional: Positive for appetite change. Negative for activity change, fatigue and unexpected weight change.  HENT: Positive for dental problem.   Cardiovascular: Positive for leg swelling.  Gastrointestinal: Positive for diarrhea.  Genitourinary: Positive for urgency and enuresis. Negative for dysuria and difficulty urinating.  Musculoskeletal: Positive for arthralgias, back pain, gait problem and joint swelling. Negative for myalgias.  Skin: Positive for color change. Negative for rash and wound.       Cyst   Neurological: Positive for dizziness, weakness and light-headedness. Negative for facial asymmetry.  Hematological: Negative for adenopathy. Bruises/bleeds easily.  Psychiatric/Behavioral: Positive for behavioral problems. Negative for confusion, self-injury and dysphoric mood. The patient is not nervous/anxious.       BP 144/82  Pulse  75  Temp(Src) 97.8 F (36.6 C) (Oral)  Resp 18  Ht 4' 11.75" (1.518 m)  Wt 124 lb 3.2 oz (56.337 kg)  BMI 24.45 kg/m2  SpO2 98% Objective:   Physical Exam  Nursing note and vitals reviewed. Constitutional: She is oriented to person, place, and time. She appears well-developed and well-nourished. No distress.  HENT:  Head: Normocephalic and atraumatic.  Poor dentition.   Eyes: Conjunctivae and EOM are normal.  Neck: Normal range of motion. No  tracheal deviation present.  Cardiovascular: Normal rate and intact distal pulses.   Pulmonary/Chest: Effort normal. No respiratory distress.  Musculoskeletal: Normal range of motion. She exhibits edema and tenderness.  2+ Dorsalis Pedis pulses bilaterally.  Trace bilateral pitting edema. Moderate varicose veins.  Moderate tenderness to palpation. Normal cap refill.  Feet and toenails normal.   Neurological: She is alert and oriented to person, place, and time.  She had a negative Rhomberg with cane. But was unable to perform pronator drift while standing due to ataxia. Gait is slowed and shuffling with pt frequently clutching cane, wall, and furniture for support.   Skin: Skin is warm and dry.  1 inch black nodular firm protrusion partially covered by thin epidermis. Fully mobile and nonadherent to subcutaneous tissue. No erythematous bleeding or invasive aspect seen.   Psychiatric: She has a normal mood and affect. Her behavior is normal.      Assessment & Plan:  Advised pt to use her walk or a 4 point cane at all times when ambulating.  Elderly person living alone - Plan: Comprehensive metabolic panel, Vit D  25 hydroxy (rtn osteoporosis monitoring), CBC, TSH, Vitamin B12, Ambulatory referral to Home Health  Debility - Plan: Comprehensive metabolic panel, Vit D  25 hydroxy (rtn osteoporosis monitoring), CBC, TSH, Vitamin B12, Ambulatory referral to Home Health  Falls frequently - Plan: POCT UA - Microscopic Only, POCT urinalysis dipstick, Comprehensive metabolic panel, CBC, TSH, Vitamin B12, Ambulatory referral to Home Health- referred for home health PT due to imbalance, safety assessment of home, and need for strengthening.  Paper Rx given for four-point cane - use that or walker at ALL times.  Urinary incontinence - Plan: POCT UA - Microscopic Only, POCT urinalysis dipstick  Decreased appetite - Plan: POCT UA - Microscopic Only, POCT urinalysis dipstick, Comprehensive metabolic  panel, Vit D  25 hydroxy (rtn osteoporosis monitoring), CBC, TSH, Vitamin B12 - family wants a protein like pill they could give her as pt often will just eat cake/cookies.  Paper Rx given for protein supplement like boost or ensure - pt is going to require enteral formula in order to sustain ADLs due to poor balanced nutrition intake, decreased appetite, decreased protein consumption that is worsening debilities.  Lightheadedness - Plan: POCT UA - Microscopic Only, POCT urinalysis dipstick, Comprehensive metabolic panel, Vit D  25 hydroxy (rtn osteoporosis monitoring), CBC, TSH, Vitamin B12 - pt refused orthostatic VS today due to time limitations.  I do think it may be worth trying to wean pt off BP meds - amlodipine could be exacerbating lower ext edema which could help as well as slightly higher BPs ok due to age as BP more likely to be labile.  Over 40 minutes spend in face-to-face eval w/ pt and coordinating pt care.  I personally performed the services described in this documentation, which was scribed in my presence. The recorded information has been reviewed and considered, and addended by me as needed.  Norberto Sorenson, MD MPH  ADDENDUM:  Vitamin D deficiency: start once weekly supplement x 6 mos  New hypothyroidism - TSH mildly elevated but free t3 low so start levothyroxine 50 mcg - f/u in 2 mos for recheck while still on the medication.  Iron deficiency anemia - needs home hemosure, start daily iron supp. Recheck in 2 mos.

## 2013-08-25 NOTE — Patient Instructions (Signed)
Fall Prevention and Home Safety Falls cause injuries and can affect all age groups. It is possible to use preventive measures to significantly decrease the likelihood of falls. There are many simple measures which can make your home safer and prevent falls. OUTDOORS  Repair cracks and edges of walkways and driveways.  Remove high doorway thresholds.  Trim shrubbery on the main path into your home.  Have good outside lighting.  Clear walkways of tools, rocks, debris, and clutter.  Check that handrails are not broken and are securely fastened. Both sides of steps should have handrails.  Have leaves, snow, and ice cleared regularly.  Use sand or salt on walkways during winter months.  In the garage, clean up grease or oil spills. BATHROOM  Install night lights.  Install grab bars by the toilet and in the tub and shower.  Use non-skid mats or decals in the tub or shower.  Place a plastic non-slip stool in the shower to sit on, if needed.  Keep floors dry and clean up all water on the floor immediately.  Remove soap buildup in the tub or shower on a regular basis.  Secure bath mats with non-slip, double-sided rug tape.  Remove throw rugs and tripping hazards from the floors. BEDROOMS  Install night lights.  Make sure a bedside light is easy to reach.  Do not use oversized bedding.  Keep a telephone by your bedside.  Have a firm chair with side arms to use for getting dressed.  Remove throw rugs and tripping hazards from the floor. KITCHEN  Keep handles on pots and pans turned toward the center of the stove. Use back burners when possible.  Clean up spills quickly and allow time for drying.  Avoid walking on wet floors.  Avoid hot utensils and knives.  Position shelves so they are not too high or low.  Place commonly used objects within easy reach.  If necessary, use a sturdy step stool with a grab bar when reaching.  Keep electrical cables out of the  way.  Do not use floor polish or wax that makes floors slippery. If you must use wax, use non-skid floor wax.  Remove throw rugs and tripping hazards from the floor. STAIRWAYS  Never leave objects on stairs.  Place handrails on both sides of stairways and use them. Fix any loose handrails. Make sure handrails on both sides of the stairways are as long as the stairs.  Check carpeting to make sure it is firmly attached along stairs. Make repairs to worn or loose carpet promptly.  Avoid placing throw rugs at the top or bottom of stairways, or properly secure the rug with carpet tape to prevent slippage. Get rid of throw rugs, if possible.  Have an electrician put in a light switch at the top and bottom of the stairs. OTHER FALL PREVENTION TIPS  Wear low-heel or rubber-soled shoes that are supportive and fit well. Wear closed toe shoes.  When using a stepladder, make sure it is fully opened and both spreaders are firmly locked. Do not climb a closed stepladder.  Add color or contrast paint or tape to grab bars and handrails in your home. Place contrasting color strips on first and last steps.  Learn and use mobility aids as needed. Install an electrical emergency response system.  Turn on lights to avoid dark areas. Replace light bulbs that burn out immediately. Get light switches that glow.  Arrange furniture to create clear pathways. Keep furniture in the same place.    Firmly attach carpet with non-skid or double-sided tape.  Eliminate uneven floor surfaces.  Select a carpet pattern that does not visually hide the edge of steps.  Be aware of all pets. OTHER HOME SAFETY TIPS  Set the water temperature for 120 F (48.8 C).  Keep emergency numbers on or near the telephone.  Keep smoke detectors on every level of the home and near sleeping areas. Document Released: 02/23/2002 Document Revised: 09/04/2011 Document Reviewed: 05/25/2011 [facility] Patient Information 2014  [place], [place].  High Protein Diet A high protein diet means that high protein foods are added to your diet. Getting more protein in the diet is important for a number of reasons. Protein helps the body to build tissue, muscle, and to repair damage. People who have had surgery, injuries such as broken bones, infections, and burns, or illnesses such as cancer, may need more protein in their diet.  SERVING SIZES Measuring foods and serving sizes helps to make sure you are getting the right amount of food. The list below tells how big or small some common serving sizes are.   1 oz.........4 stacked dice.  3 oz........Marland KitchenDeck of cards.  1 tsp.......Marland KitchenTip of little finger.  1 tbs......Marland KitchenMarland KitchenThumb.  2 tbs.......Marland KitchenGolf ball.   cup......Marland KitchenHalf of a fist.  1 cup.......Marland KitchenA fist. FOOD SOURCES OF PROTEIN Listed below are some food sources of protein and the amount of protein they contain. Your Registered Dietitian can calculate how many grams of protein you need for your medical condition. High protein foods can be added to the diet at mealtime or as snacks. Be sure to have at least 1 protein-containing food at each meal and snack to ensure adequate intake.  Meats and Meat Substitutes / Protein (g)  3 oz poultry (chicken, [place]) / 26 g  3 oz tuna, canned in water / 26 g  3 oz fish (cod) / 21 g  3 oz red meat (beef, pork) / 21 g  4 oz tofu / 9 g  1 egg / 6 g   cup egg substitute / 5 g  1 cup dried beans / 15 g  1 cup soy milk / 4 g Dairy / Protein (g)  1 cup milk (skim, 1%, 2%, whole) / 8 g   cup evaporated milk / 9 g  1 cup buttermilk / 8 g  1 cup low-fat plain yogurt / 11 g  1 cup regular plain yogurt / 9 g   cup cottage cheese / 14 g  1 oz cheddar cheese / 7 g Nuts / Protein (g)  2 tbs peanut butter / 8 g  1 oz peanuts / 7 g  2 tbs cashews / 5 g  2 tbs almonds / 5 g Document Released: 03/05/2005 Document Revised: 05/28/2011 Document Reviewed: 12/06/2006 [employer]  Patient Information 2014 [place], [place].

## 2013-08-26 LAB — TSH: TSH: 4.792 u[IU]/mL — AB (ref 0.350–4.500)

## 2013-08-26 LAB — VITAMIN B12: Vitamin B-12: 436 pg/mL (ref 211–911)

## 2013-08-26 LAB — VITAMIN D 25 HYDROXY (VIT D DEFICIENCY, FRACTURES): Vit D, 25-Hydroxy: 16 ng/mL — ABNORMAL LOW (ref 30–89)

## 2013-08-27 ENCOUNTER — Encounter: Payer: Self-pay | Admitting: Family Medicine

## 2013-08-27 DIAGNOSIS — R296 Repeated falls: Secondary | ICD-10-CM | POA: Insufficient documentation

## 2013-08-27 DIAGNOSIS — R5381 Other malaise: Secondary | ICD-10-CM | POA: Insufficient documentation

## 2013-08-27 LAB — IRON: Iron: 21 ug/dL — ABNORMAL LOW (ref 42–145)

## 2013-08-27 LAB — T4, FREE: FREE T4: 0.99 ng/dL (ref 0.80–1.80)

## 2013-08-27 LAB — FERRITIN: Ferritin: 22 ng/mL (ref 10–291)

## 2013-08-27 LAB — T3, FREE: T3, Free: 2 pg/mL — ABNORMAL LOW (ref 2.3–4.2)

## 2013-08-27 MED ORDER — ERGOCALCIFEROL 1.25 MG (50000 UT) PO CAPS: 50000.0000 [IU] | ORAL_CAPSULE | ORAL | Status: AC

## 2013-08-27 NOTE — Addendum Note (Signed)
Addended by: Norberto Sorenson on: 08/27/2013 06:00 AM   Modules accepted: Orders

## 2013-08-30 ENCOUNTER — Encounter: Payer: Self-pay | Admitting: Family Medicine

## 2013-08-30 DIAGNOSIS — E039 Hypothyroidism, unspecified: Secondary | ICD-10-CM | POA: Insufficient documentation

## 2013-08-30 DIAGNOSIS — E038 Other specified hypothyroidism: Secondary | ICD-10-CM | POA: Insufficient documentation

## 2013-08-30 DIAGNOSIS — D509 Iron deficiency anemia, unspecified: Secondary | ICD-10-CM | POA: Insufficient documentation

## 2013-08-30 MED ORDER — LEVOTHYROXINE SODIUM 50 MCG PO TABS: 50.0000 ug | ORAL_TABLET | Freq: Every day | ORAL | Status: DC

## 2013-08-30 MED ORDER — POLYSACCHARIDE IRON COMPLEX 150 MG PO CAPS: 150.0000 mg | ORAL_CAPSULE | Freq: Every day | ORAL | Status: AC

## 2013-08-30 NOTE — Addendum Note (Signed)
Addended by: Norberto SorensonSHAW, Marshell Dilauro on: 08/30/2013 09:13 AM   Modules accepted: Orders

## 2013-10-07 ENCOUNTER — Telehealth: Payer: Self-pay

## 2013-10-07 NOTE — Telephone Encounter (Signed)
REQUESTING LAST OV FOR CONTINUING CARE   --SHARON  [phone]  FAX    DONE

## 2014-01-16 ENCOUNTER — Other Ambulatory Visit: Payer: Self-pay | Admitting: Family Medicine

## 2014-02-04 ENCOUNTER — Emergency Department (HOSPITAL_COMMUNITY): Payer: Medicare Other

## 2014-02-04 ENCOUNTER — Inpatient Hospital Stay (HOSPITAL_COMMUNITY)
Admission: EM | Admit: 2014-02-04 | Discharge: 2014-02-16 | DRG: 871 | Disposition: E | Payer: Medicare Other | Attending: Pulmonary Disease | Admitting: Pulmonary Disease

## 2014-02-04 ENCOUNTER — Encounter (HOSPITAL_COMMUNITY): Payer: Self-pay | Admitting: Adult Health

## 2014-02-04 DIAGNOSIS — D649 Anemia, unspecified: Secondary | ICD-10-CM | POA: Diagnosis present

## 2014-02-04 DIAGNOSIS — N179 Acute kidney failure, unspecified: Secondary | ICD-10-CM | POA: Diagnosis present

## 2014-02-04 DIAGNOSIS — J690 Pneumonitis due to inhalation of food and vomit: Secondary | ICD-10-CM | POA: Diagnosis present

## 2014-02-04 DIAGNOSIS — R296 Repeated falls: Secondary | ICD-10-CM | POA: Diagnosis present

## 2014-02-04 DIAGNOSIS — R6521 Severe sepsis with septic shock: Secondary | ICD-10-CM | POA: Diagnosis not present

## 2014-02-04 DIAGNOSIS — R001 Bradycardia, unspecified: Secondary | ICD-10-CM | POA: Diagnosis present

## 2014-02-04 DIAGNOSIS — J9601 Acute respiratory failure with hypoxia: Secondary | ICD-10-CM

## 2014-02-04 DIAGNOSIS — F17220 Nicotine dependence, chewing tobacco, uncomplicated: Secondary | ICD-10-CM | POA: Diagnosis present

## 2014-02-04 DIAGNOSIS — Z978 Presence of other specified devices: Secondary | ICD-10-CM

## 2014-02-04 DIAGNOSIS — Z66 Do not resuscitate: Secondary | ICD-10-CM | POA: Diagnosis present

## 2014-02-04 DIAGNOSIS — D72829 Elevated white blood cell count, unspecified: Secondary | ICD-10-CM

## 2014-02-04 DIAGNOSIS — R4182 Altered mental status, unspecified: Secondary | ICD-10-CM | POA: Diagnosis present

## 2014-02-04 DIAGNOSIS — J9690 Respiratory failure, unspecified, unspecified whether with hypoxia or hypercapnia: Secondary | ICD-10-CM | POA: Diagnosis present

## 2014-02-04 DIAGNOSIS — T68XXXA Hypothermia, initial encounter: Secondary | ICD-10-CM | POA: Diagnosis not present

## 2014-02-04 DIAGNOSIS — I10 Essential (primary) hypertension: Secondary | ICD-10-CM | POA: Diagnosis present

## 2014-02-04 DIAGNOSIS — I469 Cardiac arrest, cause unspecified: Secondary | ICD-10-CM

## 2014-02-04 DIAGNOSIS — N390 Urinary tract infection, site not specified: Secondary | ICD-10-CM | POA: Diagnosis present

## 2014-02-04 DIAGNOSIS — E039 Hypothyroidism, unspecified: Secondary | ICD-10-CM | POA: Diagnosis present

## 2014-02-04 DIAGNOSIS — R579 Shock, unspecified: Secondary | ICD-10-CM

## 2014-02-04 DIAGNOSIS — X31XXXA Exposure to excessive natural cold, initial encounter: Secondary | ICD-10-CM | POA: Diagnosis present

## 2014-02-04 DIAGNOSIS — A419 Sepsis, unspecified organism: Secondary | ICD-10-CM | POA: Diagnosis present

## 2014-02-04 DIAGNOSIS — K922 Gastrointestinal hemorrhage, unspecified: Secondary | ICD-10-CM | POA: Diagnosis present

## 2014-02-04 DIAGNOSIS — E872 Acidosis: Secondary | ICD-10-CM | POA: Diagnosis present

## 2014-02-04 DIAGNOSIS — Z515 Encounter for palliative care: Secondary | ICD-10-CM | POA: Diagnosis not present

## 2014-02-04 DIAGNOSIS — Z8249 Family history of ischemic heart disease and other diseases of the circulatory system: Secondary | ICD-10-CM | POA: Diagnosis not present

## 2014-02-04 DIAGNOSIS — Z880 Allergy status to penicillin: Secondary | ICD-10-CM | POA: Diagnosis not present

## 2014-02-04 HISTORY — DX: Essential (primary) hypertension: I10

## 2014-02-04 HISTORY — DX: Anemia, unspecified: D64.9

## 2014-02-04 HISTORY — DX: Other specified hypothyroidism: E03.8

## 2014-02-04 HISTORY — DX: Hypothyroidism, unspecified: E03.9

## 2014-02-04 HISTORY — DX: Repeated falls: R29.6

## 2014-02-04 LAB — URINALYSIS, ROUTINE W REFLEX MICROSCOPIC
GLUCOSE, UA: NEGATIVE mg/dL
Ketones, ur: 15 mg/dL — AB
Nitrite: NEGATIVE
Protein, ur: 30 mg/dL — AB
SPECIFIC GRAVITY, URINE: 1.013 (ref 1.005–1.030)
Urobilinogen, UA: 0.2 mg/dL (ref 0.0–1.0)
pH: 5 (ref 5.0–8.0)

## 2014-02-04 LAB — I-STAT ARTERIAL BLOOD GAS, ED
ACID-BASE DEFICIT: 14 mmol/L — AB (ref 0.0–2.0)
Acid-base deficit: 18 mmol/L — ABNORMAL HIGH (ref 0.0–2.0)
BICARBONATE: 12.7 meq/L — AB (ref 20.0–24.0)
Bicarbonate: 15.4 mEq/L — ABNORMAL LOW (ref 20.0–24.0)
O2 SAT: 100 %
O2 Saturation: 86 %
PCO2 ART: 54.1 mmHg — AB (ref 35.0–45.0)
PH ART: 6.979 — AB (ref 7.350–7.450)
PO2 ART: 409 mmHg — AB (ref 80.0–100.0)
TCO2: 14 mmol/L (ref 0–100)
TCO2: 17 mmol/L (ref 0–100)
pCO2 arterial: 51.1 mmHg — ABNORMAL HIGH (ref 35.0–45.0)
pH, Arterial: 7.088 — CL (ref 7.350–7.450)
pO2, Arterial: 71 mmHg — ABNORMAL LOW (ref 80.0–100.0)

## 2014-02-04 LAB — CBC WITH DIFFERENTIAL/PLATELET
Basophils Absolute: 0 10*3/uL (ref 0.0–0.1)
Basophils Relative: 0 % (ref 0–1)
EOS ABS: 0 10*3/uL (ref 0.0–0.7)
Eosinophils Relative: 0 % (ref 0–5)
HEMATOCRIT: 35.5 % — AB (ref 36.0–46.0)
HEMOGLOBIN: 11.3 g/dL — AB (ref 12.0–15.0)
Lymphocytes Relative: 12 % (ref 12–46)
Lymphs Abs: 2 10*3/uL (ref 0.7–4.0)
MCH: 25.5 pg — AB (ref 26.0–34.0)
MCHC: 31.8 g/dL (ref 30.0–36.0)
MCV: 80.1 fL (ref 78.0–100.0)
MONO ABS: 2 10*3/uL — AB (ref 0.1–1.0)
MONOS PCT: 12 % (ref 3–12)
Neutro Abs: 13.2 10*3/uL — ABNORMAL HIGH (ref 1.7–7.7)
Neutrophils Relative %: 76 % (ref 43–77)
Platelets: 318 10*3/uL (ref 150–400)
RBC: 4.43 MIL/uL (ref 3.87–5.11)
RDW: 16 % — ABNORMAL HIGH (ref 11.5–15.5)
WBC: 17.2 10*3/uL — ABNORMAL HIGH (ref 4.0–10.5)

## 2014-02-04 LAB — COMPREHENSIVE METABOLIC PANEL
ALT: 22 U/L (ref 0–35)
AST: 64 U/L — AB (ref 0–37)
Albumin: 2.7 g/dL — ABNORMAL LOW (ref 3.5–5.2)
Alkaline Phosphatase: 101 U/L (ref 39–117)
Anion gap: 27 — ABNORMAL HIGH (ref 5–15)
BUN: 66 mg/dL — ABNORMAL HIGH (ref 6–23)
CO2: 13 mEq/L — ABNORMAL LOW (ref 19–32)
Calcium: 9.3 mg/dL (ref 8.4–10.5)
Chloride: 103 mEq/L (ref 96–112)
Creatinine, Ser: 1.68 mg/dL — ABNORMAL HIGH (ref 0.50–1.10)
GFR calc Af Amer: 29 mL/min — ABNORMAL LOW (ref >90)
GFR calc non Af Amer: 25 mL/min — ABNORMAL LOW (ref >90)
GLUCOSE: 104 mg/dL — AB (ref 70–99)
POTASSIUM: 4.4 meq/L (ref 3.7–5.3)
SODIUM: 143 meq/L (ref 137–147)
TOTAL PROTEIN: 5.8 g/dL — AB (ref 6.0–8.3)
Total Bilirubin: 0.4 mg/dL (ref 0.3–1.2)

## 2014-02-04 LAB — I-STAT CHEM 8, ED
BUN: 65 mg/dL — ABNORMAL HIGH (ref 6–23)
CALCIUM ION: 1.21 mmol/L (ref 1.13–1.30)
CHLORIDE: 110 meq/L (ref 96–112)
Creatinine, Ser: 1.9 mg/dL — ABNORMAL HIGH (ref 0.50–1.10)
Glucose, Bld: 104 mg/dL — ABNORMAL HIGH (ref 70–99)
HEMATOCRIT: 38 % (ref 36.0–46.0)
HEMOGLOBIN: 12.9 g/dL (ref 12.0–15.0)
Potassium: 4.1 mEq/L (ref 3.7–5.3)
Sodium: 140 mEq/L (ref 137–147)
TCO2: 16 mmol/L (ref 0–100)

## 2014-02-04 LAB — URINE MICROSCOPIC-ADD ON

## 2014-02-04 LAB — MRSA PCR SCREENING: MRSA by PCR: NEGATIVE

## 2014-02-04 LAB — I-STAT TROPONIN, ED: Troponin i, poc: 0.72 ng/mL (ref 0.00–0.08)

## 2014-02-04 LAB — GLUCOSE, CAPILLARY: Glucose-Capillary: 108 mg/dL — ABNORMAL HIGH (ref 70–99)

## 2014-02-04 LAB — CK TOTAL AND CKMB (NOT AT ARMC)
CK TOTAL: 1237 U/L — AB (ref 7–177)
CK, MB: 66.2 ng/mL (ref 0.3–4.0)
Relative Index: 5.4 — ABNORMAL HIGH (ref 0.0–2.5)

## 2014-02-04 MED ORDER — SODIUM CHLORIDE 0.9 % IV SOLN
INTRAVENOUS | Status: DC
  Administered 2014-02-04: 15:00:00 via INTRAVENOUS

## 2014-02-04 MED ORDER — NOREPINEPHRINE BITARTRATE 1 MG/ML IV SOLN
2.0000 ug/min | INTRAVENOUS | Status: DC
  Administered 2014-02-04: 50 ug/min via INTRAVENOUS
  Filled 2014-02-04: qty 16

## 2014-02-04 MED ORDER — SODIUM BICARBONATE 8.4 % IV SOLN
50.0000 meq | Freq: Once | INTRAVENOUS | Status: AC
  Administered 2014-02-04: 50 meq via INTRAVENOUS
  Filled 2014-02-04 (×2): qty 50

## 2014-02-04 MED ORDER — SODIUM CHLORIDE 0.9 % IV SOLN: 250.0000 mL | INTRAVENOUS | Status: DC | PRN

## 2014-02-04 MED ORDER — FENTANYL CITRATE 0.05 MG/ML IJ SOLN: 50.0000 ug | INTRAMUSCULAR | Status: DC | PRN

## 2014-02-04 MED ORDER — MORPHINE BOLUS VIA INFUSION
5.0000 mg | INTRAVENOUS | Status: DC | PRN
  Filled 2014-02-04: qty 20

## 2014-02-04 MED ORDER — VANCOMYCIN HCL IN DEXTROSE 750-5 MG/150ML-% IV SOLN
750.0000 mg | INTRAVENOUS | Status: DC
Start: 2014-02-06 — End: 2014-02-04

## 2014-02-04 MED ORDER — SODIUM CHLORIDE 0.9 % IV SOLN
1000.0000 mL | Freq: Once | INTRAVENOUS | Status: AC
  Administered 2014-02-04: 1000 mL via INTRAVENOUS

## 2014-02-04 MED ORDER — MIDAZOLAM HCL 2 MG/2ML IJ SOLN
INTRAMUSCULAR | Status: AC
  Filled 2014-02-04: qty 2

## 2014-02-04 MED ORDER — MIDAZOLAM HCL 5 MG/5ML IJ SOLN
INTRAMUSCULAR | Status: AC | PRN
  Administered 2014-02-04: 2 mg via INTRAVENOUS

## 2014-02-04 MED ORDER — IPRATROPIUM-ALBUTEROL 0.5-2.5 (3) MG/3ML IN SOLN
3.0000 mL | RESPIRATORY_TRACT | Status: DC
  Filled 2014-02-04: qty 3

## 2014-02-04 MED ORDER — NOREPINEPHRINE BITARTRATE 1 MG/ML IV SOLN
2.0000 ug/min | INTRAVENOUS | Status: DC
  Administered 2014-02-04: 32 ug/min via INTRAVENOUS
  Filled 2014-02-04: qty 4

## 2014-02-04 MED ORDER — SODIUM CHLORIDE 0.9 % IV BOLUS (SEPSIS)
1000.0000 mL | Freq: Once | INTRAVENOUS | Status: AC
  Administered 2014-02-04: 1000 mL via INTRAVENOUS

## 2014-02-04 MED ORDER — DEXTROSE 5 % IV SOLN
0.0000 ug/min | Freq: Once | INTRAVENOUS | Status: AC
  Administered 2014-02-04: 20 ug/min via INTRAVENOUS
  Filled 2014-02-04: qty 4

## 2014-02-04 MED ORDER — DEXTROSE 5 % IV SOLN
0.0000 ug/min | Freq: Once | INTRAVENOUS | Status: DC
  Filled 2014-02-04: qty 4

## 2014-02-04 MED ORDER — ETOMIDATE 2 MG/ML IV SOLN: 10.0000 mg | Freq: Once | INTRAVENOUS | Status: DC

## 2014-02-04 MED ORDER — EPINEPHRINE HCL 0.1 MG/ML IJ SOSY
PREFILLED_SYRINGE | INTRAMUSCULAR | Status: AC | PRN
  Administered 2014-02-04: 1 mg via INTRAVENOUS

## 2014-02-04 MED ORDER — MORPHINE SULFATE 10 MG/ML IJ SOLN
10.0000 mg/h | INTRAMUSCULAR | Status: DC
  Administered 2014-02-04: 1 mg/h via INTRAVENOUS
  Filled 2014-02-04: qty 10

## 2014-02-04 MED ORDER — SODIUM CHLORIDE 0.9 % IV SOLN
1000.0000 mL | INTRAVENOUS | Status: DC
  Administered 2014-02-04: 1000 mL via INTRAVENOUS

## 2014-02-04 MED ORDER — HEPARIN (PORCINE) IN NACL 100-0.45 UNIT/ML-% IJ SOLN
650.0000 [IU]/h | INTRAMUSCULAR | Status: DC
  Administered 2014-02-04: 650 [IU]/h via INTRAVENOUS
  Filled 2014-02-04 (×2): qty 250

## 2014-02-04 MED ORDER — PANTOPRAZOLE SODIUM 40 MG IV SOLR: 40.0000 mg | Freq: Every day | INTRAVENOUS | Status: DC

## 2014-02-04 MED ORDER — SODIUM CHLORIDE 0.9 % IV BOLUS (SEPSIS): 500.0000 mL | INTRAVENOUS | Status: DC | PRN

## 2014-02-04 MED ORDER — VANCOMYCIN HCL IN DEXTROSE 1-5 GM/200ML-% IV SOLN
1000.0000 mg | Freq: Once | INTRAVENOUS | Status: DC
  Filled 2014-02-04: qty 200

## 2014-02-04 MED ORDER — SODIUM CHLORIDE 0.9 % IV SOLN
1000.0000 mL | Freq: Once | INTRAVENOUS | Status: AC
Start: 2014-02-04 — End: 2014-02-04
  Administered 2014-02-04: 1000 mL via INTRAVENOUS

## 2014-02-04 MED ORDER — PIPERACILLIN-TAZOBACTAM IN DEX 2-0.25 GM/50ML IV SOLN
2.2500 g | Freq: Three times a day (TID) | INTRAVENOUS | Status: DC
  Administered 2014-02-04: 2.25 g via INTRAVENOUS
  Filled 2014-02-04 (×4): qty 50

## 2014-02-04 MED ORDER — MIDAZOLAM HCL 2 MG/2ML IJ SOLN: 1.0000 mg | INTRAMUSCULAR | Status: DC | PRN

## 2014-02-04 MED ORDER — ALBUTEROL SULFATE (2.5 MG/3ML) 0.083% IN NEBU: 2.5000 mg | INHALATION_SOLUTION | RESPIRATORY_TRACT | Status: DC | PRN

## 2014-02-04 MED FILL — Medication: Qty: 1 | Status: AC

## 2014-02-07 ENCOUNTER — Telehealth: Payer: Self-pay | Admitting: Family Medicine

## 2014-02-07 NOTE — Telephone Encounter (Signed)
[facility] stating that patient died on Thursday 2013/06/24. They need a death certificate signed by Dr. Clelia CroftShaw and they plan to bring that by on Monday 02/08/2014 during her office hours. This is just an [employer] message.  Thanks, [employer]

## 2014-02-08 NOTE — Telephone Encounter (Signed)
Death certificate in Dr. Alver FisherShaw's box.

## 2014-02-08 NOTE — Telephone Encounter (Signed)
[facility] put in pick up drawer. Courier will be picking up.

## 2014-02-08 NOTE — Telephone Encounter (Signed)
Looks like pt passed away in the hosp - life support was w/d while pt was in ICU under Dr. Leland HerYacoub Lebaeur [facility]. Please have them complete certificate.  I only saw this pt once. thanks

## 2014-02-13 NOTE — Discharge Summary (Signed)
NAMGillis Santa:  [place], Zeidy                   ACCOUNT NO.:  [id number]  MEDICAL RECORD NO.:  [id number]  LOCATION:  2M10C                        FACILITY:  [facility]  PHYSICIAN:  Felipa EvenerWesam Jake Jhanae Jaskowiak, MD  DATE OF BIRTH:  Jul 25, 1917  DATE OF ADMISSION:  Aug 11, 2013 DATE OF DISCHARGE:  Aug 11, 2013                              DISCHARGE SUMMARY   DEATH SUMMARY:  PRIMARY DIAGNOSIS/CAUSE OF DEATH:  Pulseless electrical activity and cardiac arrest.  SECONDARY DIAGNOSES:  Respiratory failure, hypotension, hypothermia, hypoxemia, respiratory acidosis, septic shock, acute renal failure, metabolic acidosis, gastrointestinal bleeding.  The patient is a 80 year old female with a extensive past medical history, who was last seen normal 2 days prior to presentation who was found outside her home hypothermic and unresponsive, brought to the emergency department.  The patient lost pulse briefly in the emergency department and CPR was performed.  Return of spontaneous circulation established.  The patient was __________ continued to be unresponsive. The patient's family arrived and upon discussion with the family __________the patient would have never wanted to be on life support, and __________ on multiple occasions and given poor prognosis of her current condition, decision was made to make the patient comfort care.  Morphine was started, withdrawal order replaced.  Awaiting family's arrival in order to proceed with extubation.  The patient was extubated and expired comfortably with family at bedside.     Felipa EvenerWesam Jake Mitsy Owen, MD     WJY/MEDQ  D:  02/12/2014  T:  02/13/2014  Job:  [id number]

## 2014-02-16 NOTE — Code Documentation (Signed)
EDP at bedside to place central line 

## 2014-02-16 NOTE — Progress Notes (Signed)
Patient compassionately extubated at this time.  Patient has morphine gtt infusing at this time for air hunger.  Family at bedside.  Will continue to monitor.

## 2014-02-16 NOTE — Code Documentation (Signed)
Pt cleaned on vomit and stool

## 2014-02-16 NOTE — Code Documentation (Signed)
Pt placed on zoll pads and monitor.  

## 2014-02-16 NOTE — Progress Notes (Signed)
  Echocardiogram 2D Echocardiogram has been performed.  [name] 09-28-2013, 3:20 PM

## 2014-02-16 NOTE — Procedures (Signed)
Extubation Procedure Note  Patient Details:   Name: [name] DOB: 05/25/1917 MRN: [id number]   Airway Documentation:  Airway 7.5 mm (Active)  Secured at (cm) 21 cm 12/09/2013  2:45 PM  Measured From Lips 12/09/2013  2:45 PM  Secured Location Left 12/09/2013  2:45 PM  Secured By [employer] Tube Holder 12/09/2013  2:45 PM  Tube Holder Repositioned Yes 12/09/2013  2:45 PM    Evaluation  O2 sats: transiently fell during during procedure Complications: Terminal Extubation Patient did tolerate procedure well. Bilateral Breath Sounds: Rhonchi Suctioning: Airway   Antoine Pocherogdon, Keiri Solano Caroline 12/09/2013, 6:19 PM

## 2014-02-16 NOTE — ED Notes (Signed)
EMS-pt was found by neighbors on back porch, had not been seen in 2 days. Pt cool to touch only responsive to physical stimuli.

## 2014-02-16 NOTE — ED Notes (Signed)
Pt returned from CT scan. DIL at the bedside.

## 2014-02-16 NOTE — ED Notes (Signed)
Dr. Yacoub at the bedside.  

## 2014-02-16 NOTE — Progress Notes (Addendum)
ANTIBIOTIC CONSULT NOTE - INITIAL  Pharmacy Consult for vancomycin and Zosyn Indication: sepsis  Allergies  Allergen Reactions  . Penicillins     Patient Measurements: Height: 5\' 3"  (160 cm) Weight: 124 lb (56.246 kg) IBW/kg (Calculated) : 52.4   Vital Signs: Temp Source: Core (Comment) (11/19 1134) BP: 96/50 mmHg (11/19 1300) Pulse Rate: 75 (11/19 1300) Intake/Output from previous day:   Intake/Output from this shift: Total I/O In: 4000 [I.V.:4000] Out: -   Labs:  Recent Labs  06-17-2013 1050 06-17-2013 1114  WBC 17.2*  --   HGB 11.3* 12.9  PLT 318  --   CREATININE 1.68* 1.90*   Estimated Creatinine Clearance: 15 mL/min (by C-G formula based on Cr of 1.9). No results for input(s): VANCOTROUGH, VANCOPEAK, VANCORANDOM, GENTTROUGH, GENTPEAK, GENTRANDOM, TOBRATROUGH, TOBRAPEAK, TOBRARND, AMIKACINPEAK, AMIKACINTROU, AMIKACIN in the last 72 hours.   Microbiology: No results found for this or any previous visit (from the past 720 hour(s)).  Medical History: Past Medical History  Diagnosis Date  . Hypertension   . Frequent falls   . Anemia   . Subclinical hypothyroidism     Assessment: [age] YOF found down by neighbor- was last seen 2 days ago in usual state of health. Originally bradycardic, then became asystolic and CPR was performed in ED. Patient now intubated. BP is soft. WBC elevated at 17.2, hypothermic. SCr 1.9 with est CrCl ~6515mL/min.  Noted patient has history of penicillin allergy- NO reaction is listed. Patient has not received a penicillin-related medication within our system that is able to be seen/assessed for tolerance.  Note: heparin has been discontinued by CCM  Goal of Therapy:  Vancomycin trough level 15-20 mcg/ml  Plan:  1. Vancomycin 1000mg  IV x1 as loading dose, then 750mg  IV q48h 2. Zosyn 2.25g IV q8h 3. Follow c/s, clinical progression, renal function, [facility]  Avyana Puffenbarger D. Ceylin Dreibelbis, PharmD, BCPS Clinical Pharmacist Pager:  [phone] 26-Oct-2013 1:31 PM

## 2014-02-16 NOTE — Progress Notes (Signed)
Chaplain visited with patient who was presented to ED via EMS after being found unresponsive at home on screen porch. Chaplain made call to patient grandson and doctor gave him update on patient condition. Patient grandson is about three hours away but in route to hospital. Patient is being intubated. Patient grand-daughter in law is in route to hospital.( Willey BladeKristina Albert)  Provided support to staff.  Will follow as needed.   Mar 27, 2013 1000  Clinical Encounter Type  Visited With Patient;Health care provider  Visit Type Initial;Spiritual support;ED;Trauma  Referral From Nurse  Spiritual Encounters  Spiritual Needs Prayer;Emotional  Stress Factors  Family Stress Factors Exhausted;Lack of knowledge  Venida JarvisWatlington, Joran Kallal, Chaplain,pager [phone]

## 2014-02-16 NOTE — H&P (Signed)
PULMONARY / CRITICAL CARE MEDICINE   Name: Julien NordmannCora M Trautner MRN: [id number] DOB: 02/09/1918    ADMISSION DATE:  February 28, 2014  REFERRING MD :  EDP  CHIEF COMPLAINT:  Respiratory failure   INITIAL PRESENTATION: 80yo female with hx HTN, frequent falls, found down outside of her home, cold and unresponsive.  Last seen 2 days prior. Hypotensive, hypothermic, hypoxic on arrival to ER and became pulseless.  Received 1 round epi with ROSC.  PCCM called to admit.   STUDIES:  CT head 11/19>>>neg acute, extensive atrophy  SIGNIFICANT EVENTS: 11/19>>> found down outside at home, brief CPR    HISTORY OF PRESENT ILLNESS: 80yo female with hx HTN, frequent falls, found down outside of her home, cold and unresponsive.  Last seen 2 days prior. Hypotensive, hypothermic, hypoxic on arrival to ER and became pulseless.  Received 1 round epi with ROSC.  PCCM called to admit.  Per granddaughter was in her usual state of health 2 days ago. Lives alone in her own home.   PAST MEDICAL HISTORY :   has a past medical history of Hypertension; Frequent falls; Anemia; and Subclinical hypothyroidism.  has past surgical history that includes Joint replacement. Prior to Admission medications   Medication Sig Start Date End Date Taking? Authorizing Provider  amLODipine (NORVASC) 10 MG tablet Take 1 tablet (10 mg total) by mouth daily. 08/09/11 08/25/13  Lamar LaundryLaura C Mayans, MD  ergocalciferol (VITAMIN D2) 50000 UNITS capsule Take 1 capsule (50,000 Units total) by mouth once a week. 08/27/13   Sherren MochaEva N Shaw, MD  iron polysaccharides (NU-IRON) 150 MG capsule Take 1 capsule (150 mg total) by mouth daily. 08/30/13   Sherren MochaEva N Shaw, MD  levothyroxine (SYNTHROID, LEVOTHROID) 50 MCG tablet TAKE 1 TABLET (50 MCG TOTAL) BY MOUTH DAILY. NEEDS OV BEFORE MED RUNS OUT. 01/18/14   Chelle S Jeffery, PA-C  lisinopril (PRINIVIL,ZESTRIL) 20 MG tablet Take 1 tablet (20 mg total) by mouth daily. 08/09/11 08/25/13  Lamar LaundryLaura C Mayans, MD   Allergies  Allergen Reactions   . Penicillins     FAMILY HISTORY:  indicated that her father is deceased. She indicated that her sister is deceased.  SOCIAL HISTORY:  reports that she has never smoked. Her smokeless tobacco use includes Snuff.  REVIEW OF SYSTEMS:  Unable. Pt sedated on vent.   SUBJECTIVE:   VITAL SIGNS: Pulse Rate:  [48-86] 77 (11/19 1315) Resp:  [18-25] 25 (11/19 1315) BP: (64-110)/(35-54) 89/42 mmHg (11/19 1315) SpO2:  [43 %-100 %] 100 % (11/19 1315) FiO2 (%):  [100 %] 100 % (11/19 1050) Weight:  [124 lb (56.246 kg)] 124 lb (56.246 kg) (11/19 1134) HEMODYNAMICS: CVP:  [1 mmHg-15 mmHg] 4 mmHg VENTILATOR SETTINGS: Vent Mode:  [-] PRVC FiO2 (%):  [100 %] 100 % Set Rate:  [16 bmp] 16 bmp Vt Set:  [420 mL] 420 mL PEEP:  [5 cmH20] 5 cmH20 Plateau Pressure:  [24 cmH20] 24 cmH20 INTAKE / OUTPUT:  Intake/Output Summary (Last 24 hours) at 10/07/2013 1346 Last data filed at 10/07/2013 1142  Gross per 24 hour  Intake   4000 ml  Output      0 ml  Net   4000 ml    PHYSICAL EXAMINATION: General:  Frail, thin, elderly female Neuro:  Sedated on vent, RASS -1 grimaces to pain HEENT:  Mm dry, dried coffee ground emesis, ETT  Cardiovascular:  s1s2 rrr Lungs:  resps even, non labored on vent, coarse throughout  Abdomen:  Soft, hypoactive bs  Musculoskeletal:  Cool, pale,  dry, no edema    LABS:  CBC  Recent Labs Lab 08/26/13 1050 08/26/13 1114  WBC 17.2*  --   HGB 11.3* 12.9  HCT 35.5* 38.0  PLT 318  --    Coag's No results for input(s): APTT, INR in the last 168 hours. BMET  Recent Labs Lab 08/26/13 1050 08/26/13 1114  NA 143 140  K 4.4 4.1  CL 103 110  CO2 13*  --   BUN 66* 65*  CREATININE 1.68* 1.90*  GLUCOSE 104* 104*   Electrolytes  Recent Labs Lab 08/26/13 1050  CALCIUM 9.3   Sepsis Markers No results for input(s): LATICACIDVEN, PROCALCITON, O2SATVEN in the last 168 hours. ABG  Recent Labs Lab 08/26/13 1125  PHART 6.979*  PCO2ART 54.1*  PO2ART 409.0*    Liver Enzymes  Recent Labs Lab 08/26/13 1050  AST 64*  ALT 22  ALKPHOS 101  BILITOT 0.4  ALBUMIN 2.7*   Cardiac Enzymes No results for input(s): TROPONINI, PROBNP in the last 168 hours. Glucose No results for input(s): GLUCAP in the last 168 hours.  Imaging No results found.   ASSESSMENT / PLAN:  PULMONARY OETT 11/19>>> Acute hypoxic respiratory failure  R/o aspiration PNA  Respiratory acidosis P:   Cont vent support - 8cc/kg  Daily SBT and WUA  F/u CXR  F/u ABG - breathing well over vent so hold RR for now abx as below  Sputum culture    CARDIOVASCULAR CVL R IJ CVL 11/19>>> Hypotension/ shock - ?sepsis r/t UTI  Hx HTN  P:  F/u lactate  Cont volume resuscitation  Pressors as needed to maintain MAP >65 2D echo pending  Trend troponin  Monitor CVP   RENAL Acute renal failure  ?rhabdo  Metabolic acidosis  P:   F/u CK Volume resuscitation  F/u chem   GASTROINTESTINAL ??GI bleed - coffee ground emesis  P:   PPI  NPO  OG tube  Monitor   HEMATOLOGIC No active issue  P:  F/u cbc  SCD's with ?GI bleeding   INFECTIOUS R/o aspiration PNA  ?sepsis  UTI P:   BCx2 11/19>>> UC 11/19>>> Sputum11/19>>> Vancomycin 11/19>>> Zosyn 11/19>>> Check pct, f/u lactate   ENDOCRINE Subclinical hypothyroid  P:   F/u TSH   NEUROLOGIC AMS - unclear etiology.  CT head neg.  P:   RASS goal: -1 PRN fentanyl, versed   Dirk DressKaty Whiteheart, NP 09/25/2013  1:46 PM Pager: [phone] or [phone]  80 year old female with limited PMH who was found down outside for unknown time, brief cardiac arrest, f/u ABG noted, spoke with patient's DPOA (grandfather), after discussion, decision was made to make patient LCB with no CPR/cardioversion.  Patient is not a candidate for hypothermia due to very brief downtime and aspiration PNA/sepsis.   Discussed at length with granddaughter Belenda CruiseKristin at bedside and grandson danny via phone.  Pt had a living will and was  clear that she would not want to live on life support for any extended period.  She does not like doctors or taking medications and would not want aggressive care for any prolonged period of time.  Her grandson is on his way from ~3 hrs away and will likely discuss withdrawal of care over next 24 hours depending on her progress.  In the meantime they wish to make her a DNR, no escalation of care.     My CC time 45 min independent of extender.  Patient seen and examined, agree with above note.  I dictated the care and orders written for this patient under my direction.  Eduardo Wurth G Avyan Livesay, MD [phone] 

## 2014-02-16 NOTE — ED Notes (Signed)
Per [facility], pt was found in porch lying down. Neighbor checked on her 2 days ago. Pt has a 20 g to LAC. Pt was on NRB.

## 2014-02-16 NOTE — Progress Notes (Signed)
ANTICOAGULATION CONSULT NOTE - Initial Consult  Pharmacy Consult for heparin Indication: ?NSTEMI causing unresponsiveness and acidotic state  Allergies  Allergen Reactions  . Penicillins     Patient Measurements: Height: 5\' 3"  (160 cm) Weight: 124 lb (56.246 kg) IBW/kg (Calculated) : 52.4 Heparin Dosing Weight: 56kg  Vital Signs: Temp Source: Core (Comment) (11/19 1134) BP: 69/36 mmHg (11/19 1145) Pulse Rate: 63 (11/19 1145)  Labs:  Recent Labs  10/23/13 1050 10/23/13 1114  HGB 11.3* 12.9  HCT 35.5* 38.0  PLT 318  --   CREATININE  --  1.90*    Estimated Creatinine Clearance: 15 mL/min (by C-G formula based on Cr of 1.9).   Medical History: No past medical history on file.   Assessment: [age] YOF who was found down in screened in porch outside by neighbors- was last seen 2 days ago by neighbor. Very brady pulsed felt by EMT, then became asystolic andr required 2 minutes of CPR in ED. Originally responding to painful stumuli. Patient's grandson live ~3hrs away and is on his way to the hospital per notes. Last medication history obtained in June of this year- at that time patient was not on anticoagulants. No more recent encounter notation which contradicts this.  To start on low dose heparin per EDP for suspected NSTEMI causing acidotic state (pH 6.9). Troponins slightly elevated. Concerned she may be coagulopathic.  Goal of Therapy:  Heparin level 0.3-0.7 units/ml Monitor platelets by anticoagulation protocol: Yes   Plan:  1. Start low dose heparin of 12units/kg/hr = 650 units/hr 2. Will check heparin level in 8 hours 3. Ordered an add-on INR and aPTT to get some sense of patient's coagulation status 4. Daily heparin level and CBC 5. Follow for care plan, cardiac workup, follow up labs  Stevon Gough D. Avik Leoni, PharmD, BCPS Clinical Pharmacist Pager: [phone] 12/31/13 12:04 PM

## 2014-02-16 NOTE — Progress Notes (Signed)
Morphine gtt 95 mls wasted in sink.  Eddie NorthJennifer Clifton RN witnessed.

## 2014-02-16 NOTE — ED Notes (Signed)
Dr. Molli KnockYacoub speaking with the son on the phone.

## 2014-02-16 NOTE — Progress Notes (Signed)
Grandson arrived, after conversation, decision was made to proceed with comfort care.  Will start morphine now and once family is ready will terminally extubate later today.  Additional CC time for me 45 min.  Alyson ReedyWesam G. Yacoub, M.D. [facility]. Pager: [phone]. After hours pager: [phone].

## 2014-02-16 NOTE — ED Provider Notes (Signed)
CSN: [id number]     Arrival date & time 01/17/14  1041 History   First MD Initiated Contact with Patient 01/17/14 1118     Chief Complaint  Patient presents with  . Altered Mental Status     (Consider location/radiation/quality/duration/timing/severity/associated sxs/prior Treatment) HPI Comments: The patient is a critically ill [age] year old female who was found outside of her home on her screen didn't porch cold and unresponsive. According to the paramedics she had a pulse of around 60, she was hypoxic, hypotensive, responded only to painful sting light. The patient is unable to give any history, she was last seen 2 days ago.  Patient is a 80 y.o. female presenting with altered mental status. The history is provided by the EMS personnel.  Altered Mental Status   No past medical history on file. Past Surgical History  Procedure Laterality Date  . Joint replacement     Family History  Problem Relation Age of Onset  . Heart disease Father   . Hyperlipidemia Sister    History  Substance Use Topics  . Smoking status: Never Smoker   . Smokeless tobacco: Current User    Types: Snuff  . Alcohol Use: Not on file   OB History    No data available     Review of Systems  Unable to perform ROS: Acuity of condition      Allergies  Penicillins  Home Medications   Prior to Admission medications   Medication Sig Start Date End Date Taking? Authorizing Provider  amLODipine (NORVASC) 10 MG tablet Take 1 tablet (10 mg total) by mouth daily. 08/09/11 08/25/13  Lamar LaundryLaura C Mayans, MD  ergocalciferol (VITAMIN D2) 50000 UNITS capsule Take 1 capsule (50,000 Units total) by mouth once a week. 08/27/13   Sherren MochaEva N Shaw, MD  iron polysaccharides (NU-IRON) 150 MG capsule Take 1 capsule (150 mg total) by mouth daily. 08/30/13   Sherren MochaEva N Shaw, MD  levothyroxine (SYNTHROID, LEVOTHROID) 50 MCG tablet TAKE 1 TABLET (50 MCG TOTAL) BY MOUTH DAILY. NEEDS OV BEFORE MED RUNS OUT. 01/18/14   Chelle S Jeffery, PA-C   lisinopril (PRINIVIL,ZESTRIL) 20 MG tablet Take 1 tablet (20 mg total) by mouth daily. 08/09/11 08/25/13  Lamar LaundryLaura C Mayans, MD   BP 102/54 mmHg  Pulse 72  Temp(Src)   Resp 20  Ht 5\' 3"  (1.6 m)  Wt 124 lb (56.246 kg)  BMI 21.97 kg/m2  SpO2 100% Physical Exam  Constitutional: She appears distressed.  HENT:  Head: Normocephalic and atraumatic.  Voluminous amounts of bilious type material from the patient's mouth, she is known to chew tobacco  Eyes: Conjunctivae are normal. Right eye exhibits no discharge. Left eye exhibits no discharge. No scleral icterus.  Mild lysed Nicole KindredAgnes, no deviated gaze, pupillary exam is normal  Neck: Normal range of motion. Neck supple. No JVD present. No thyromegaly present.  Cardiovascular: Regular rhythm, normal heart sounds and intact distal pulses.  Exam reveals no gallop and no friction rub.   No murmur heard. Bradycardic, then patient became asystolic  Pulmonary/Chest: Effort normal and breath sounds normal. No respiratory distress. She has no wheezes. She has no rales.  Decreased breath sounds bilaterally, spontaneous breathing at a very bradypneic Rate  Abdominal: Soft. Bowel sounds are normal. She exhibits no distension and no mass. There is no tenderness.  Musculoskeletal: Normal range of motion. She exhibits no edema or tenderness.  Lymphadenopathy:    She has no cervical adenopathy.  Neurological:  Obtunded, initially responded to painful stimuli, then decrease  in mental status and no response.  Skin: Skin is warm and dry. No rash noted. No erythema.  Psychiatric: She has a normal mood and affect. Her behavior is normal.  Nursing note and vitals reviewed.   ED Course  CENTRAL LINE Date/Time: February 09, 2014 11:15 AM Performed by: Eber Hong D Authorized by: Eber Hong D Consent: The procedure was performed in an emergent situation. Required items: required blood products, implants, devices, and special equipment available Patient identity  confirmed: hospital-assigned identification number Time out: Immediately prior to procedure a "time out" was called to verify the correct patient, procedure, equipment, support staff and site/side marked as required. Indications: vascular access and central pressure monitoring Patient sedated: no Preparation: skin prepped with 2% chlorhexidine Skin prep agent dried: skin prep agent completely dried prior to procedure Sterile barriers: all five maximum sterile barriers used - cap, mask, sterile gown, sterile gloves, and large sterile sheet Location details: right internal jugular Patient position: flat Catheter type: triple lumen Pre-procedure: landmarks identified Ultrasound guidance: no Number of attempts: 1 Successful placement: yes Post-procedure: line sutured Assessment: blood return through all ports,  free fluid flow and placement verified by x-ray Patient tolerance: Patient tolerated the procedure well with no immediate complications   (including critical care time) Labs Review Labs Reviewed  CBC WITH DIFFERENTIAL - Abnormal; Notable for the following:    WBC 17.2 (*)    Hemoglobin 11.3 (*)    HCT 35.5 (*)    MCH 25.5 (*)    RDW 16.0 (*)    Neutro Abs 13.2 (*)    Monocytes Absolute 2.0 (*)    All other components within normal limits  CK TOTAL AND CKMB - Abnormal; Notable for the following:    Total CK 1237 (*)    CK, MB 66.2 (*)    Relative Index 5.4 (*)    All other components within normal limits  COMPREHENSIVE METABOLIC PANEL - Abnormal; Notable for the following:    CO2 13 (*)    Glucose, Bld 104 (*)    BUN 66 (*)    Creatinine, Ser 1.68 (*)    Total Protein 5.8 (*)    Albumin 2.7 (*)    AST 64 (*)    GFR calc non Af Amer 25 (*)    GFR calc Af Amer 29 (*)    Anion gap 27 (*)    All other components within normal limits  URINALYSIS, ROUTINE W REFLEX MICROSCOPIC - Abnormal; Notable for the following:    APPearance TURBID (*)    Hgb urine dipstick LARGE (*)     Bilirubin Urine SMALL (*)    Ketones, ur 15 (*)    Protein, ur 30 (*)    Leukocytes, UA LARGE (*)    All other components within normal limits  URINE MICROSCOPIC-ADD ON - Abnormal; Notable for the following:    Squamous Epithelial / LPF MANY (*)    Bacteria, UA FEW (*)    Casts GRANULAR CAST (*)    All other components within normal limits  I-STAT CHEM 8, ED - Abnormal; Notable for the following:    BUN 65 (*)    Creatinine, Ser 1.90 (*)    Glucose, Bld 104 (*)    All other components within normal limits  I-STAT TROPOININ, ED - Abnormal; Notable for the following:    Troponin i, poc 0.72 (*)    All other components within normal limits  I-STAT ARTERIAL BLOOD GAS, ED - Abnormal; Notable for the following:  pH, Arterial 6.979 (*)    pCO2 arterial 54.1 (*)    pO2, Arterial 409.0 (*)    Bicarbonate 12.7 (*)    Acid-base deficit 18.0 (*)    All other components within normal limits  PROTIME-INR  APTT  HEPARIN LEVEL (UNFRACTIONATED)    Imaging Review Ct Head Wo Contrast  28-Feb-2014   CLINICAL DATA:  80 yo  found down on her porch  EXAM: CT HEAD WITHOUT CONTRAST  CT CERVICAL SPINE WITHOUT CONTRAST  TECHNIQUE: Multidetector CT imaging of the head and cervical spine was performed following the standard protocol without intravenous contrast. Multiplanar CT image reconstructions of the cervical spine were also generated.  COMPARISON:  Chest x-ray 28-Feb-2014.  FINDINGS: CT HEAD FINDINGS  No intracranial hemorrhage. No parenchymal contusion. No midline shift or mass effect. Basilar cisterns are patent. No skull base fracture. Orbits are normal.  There is extensive cortical atrophy and ventricular dilatation which is proportional. Extensive periventricular subcortical white matter hypodensities.  Mastoid air cells are clear. There is fluid in the maxillary sinuses. Patient intubated.  CT CERVICAL SPINE FINDINGS  Prevertebral soft tissues are not well evaluated due to intubation. Normal  alignment cervical vertebral bodies. Normal facet articulation. Normal craniocervical junction. There is degenerative change at the dens. There is biapical pulmonary scarring.  IMPRESSION: CT head:  1. No evidence of intracranial trauma. 2. Extensive cerebral atrophy and proportional ventricular dilatation. 3. Extensive chronic white matter microvascular disease. 4. Fluid in the paranasal sinuses. CT cervical spine:  1. No evidence cervical spine fracture.  Two intubated patient. 2. Multilevel degenerative disc disease. Therefore biapical pulmonary scarring.   Electronically Signed   By: Genevive BiStewart  Edmunds M.D.   On: 28-Feb-2014 12:53   Ct Cervical Spine Wo Contrast  28-Feb-2014   CLINICAL DATA:  80 yo  found down on her porch  EXAM: CT HEAD WITHOUT CONTRAST  CT CERVICAL SPINE WITHOUT CONTRAST  TECHNIQUE: Multidetector CT imaging of the head and cervical spine was performed following the standard protocol without intravenous contrast. Multiplanar CT image reconstructions of the cervical spine were also generated.  COMPARISON:  Chest x-ray 28-Feb-2014.  FINDINGS: CT HEAD FINDINGS  No intracranial hemorrhage. No parenchymal contusion. No midline shift or mass effect. Basilar cisterns are patent. No skull base fracture. Orbits are normal.  There is extensive cortical atrophy and ventricular dilatation which is proportional. Extensive periventricular subcortical white matter hypodensities.  Mastoid air cells are clear. There is fluid in the maxillary sinuses. Patient intubated.  CT CERVICAL SPINE FINDINGS  Prevertebral soft tissues are not well evaluated due to intubation. Normal alignment cervical vertebral bodies. Normal facet articulation. Normal craniocervical junction. There is degenerative change at the dens. There is biapical pulmonary scarring.  IMPRESSION: CT head:  1. No evidence of intracranial trauma. 2. Extensive cerebral atrophy and proportional ventricular dilatation. 3. Extensive chronic white matter  microvascular disease. 4. Fluid in the paranasal sinuses. CT cervical spine:  1. No evidence cervical spine fracture.  Two intubated patient. 2. Multilevel degenerative disc disease. Therefore biapical pulmonary scarring.   Electronically Signed   By: Genevive BiStewart  Edmunds M.D.   On: 28-Feb-2014 12:53   Dg Chest Portable 1 View  28-Feb-2014   CLINICAL DATA:  Acute respiratory failure. On ventilator. Retraction of central line.  EXAM: PORTABLE CHEST - 1 VIEW  COMPARISON:  May 28, 2013  FINDINGS: Endotracheal tube remains in appropriate position. The right jugular central venous catheter tip now overlies the superior cavoatrial junction. No pneumothorax identified.  Coarse lung markings  in the lung bases are likely related to chronic disease. No evidence of pulmonary airspace disease or acute pulmonary edema. No evidence of pleural effusion. Heart size is stable.  IMPRESSION: Endotracheal tube and central venous catheter in appropriate position. No evidence of pneumothorax.  Probable chronic bibasilar interstitial prominence. No acute findings.   Electronically Signed   By: Myles Rosenthal M.D.   On: Feb 08, 2014 12:24   Dg Chest Port 1 View  2014/02/08   CLINICAL DATA:  Assess tube and line placement  EXAM: PORTABLE CHEST - 1 VIEW  COMPARISON:  PA and lateral chest of September 17, 2007.  FINDINGS: The lungs are well-expanded. The interstitial markings are coarse especially on the left. External pacing defibrillator pads are present. The heart is normal in size. The pulmonary vascularity is not engorged. There is no pleural effusion. The endotracheal tube tip lies approximately 5.5 cm above the crotch of the carina. The right internal jugular venous catheter tip project over the distal third of the SVC. There is a cardiac rhythm recording device present over the lower thoracic spine.  IMPRESSION: Positioning of the endotracheal tube and right internal jugular venous catheter is as described. Coarse lung markings in the left  perihilar region may reflect subsegmental atelectasis or asymmetric interstitial edema.   Electronically Signed   By: David  [place]   On: Feb 08, 2014 12:10     EKG Interpretation   Date/Time:  Thursday 02-08-14 10:52:00 EST Ventricular Rate:  83 PR Interval:    QRS Duration: 133 QT Interval:  534 QTC Calculation: 628 R Axis:   86 Text Interpretation:  Atrial fibrillation Ventricular premature complex  Right bundle branch block Nonspecific T abnormalities, lateral leads  Baseline wander in lead(s) V1 Abnormal ekg Since last tracing changes now  seen Confirmed by Jansen Goodpasture  MD, Stephine Langbehn ([id number]) on 2014/02/08 11:19:26 AM      MDM   Final diagnoses:  Endotracheal tube present  Hypothermia, initial encounter  Acute renal failure, unspecified acute renal failure type  Leukocytosis  UTI (lower urinary tract infection)  Shock    The patient is currently being actively resuscitated, she required 2 doses of intravenous epinephrine and approximately 2 minutes of CPR which I directed. She also required intubation on arrival for protection of her airway due to the significant amount of emesis. The patient then required a central line placement for severe hypothermia, severe hypotension and unresponsiveness with poor peripheral access. I discussed the patient's care with her grandson who states that he is healthcare power of attorney as well as the next of kin, he states that he would like anything to be done, he is on his way and will be here within 4 hours according to his report.  The patient is severely acidotic based on ABG which I drew from the patient's right femoral artery. The pH was 6.9, oxygenating well despite oxygen monitor in the room stating 90%, CO2 of 54, the patient is severely acidotic and will receive some bicarbonate. Warm fluids being given, 3 L thus far, will discussed with critical care.  Cardiopulmonary Resuscitation (CPR) Procedure Note Directed/Performed by:  Vida Roller I personally directed ancillary staff and/or performed CPR in an effort to regain return of spontaneous circulation and to maintain cardiac, neuro and systemic perfusion.   INTUBATION Performed by: Vida Roller  Required items: required blood products, implants, devices, and special equipment available Patient identity confirmed: provided demographic data and hospital-assigned identification number Time out: Immediately prior to procedure a "time out"  was called to verify the correct patient, procedure, equipment, support staff and site/side marked as required.  Indications: Altered mental status, not protecting airway   Intubation method: Directed Laryngoscopy   Preoxygenation: BVM  Sedatives: None Paralytic: None  Tube Size: 7.5 cuffed  Post-procedure assessment: chest rise and ETCO2 monitor Breath sounds: equal and absent over the epigastrium Tube secured with: ETT holder Chest x-ray interpreted by radiologist and me.  Chest x-ray findings: endotracheal tube in appropriate position  Patient tolerated the procedure well with no immediate complications.   CRITICAL CARE Performed by: Vida Roller Total critical care time: 35 Critical care time was exclusive of separately billable procedures and treating other patients. Critical care was necessary to treat or prevent imminent or life-threatening deterioration. Critical care was time spent personally by me on the following activities: development of treatment plan with patient and/or surrogate as well as nursing, discussions with consultants, evaluation of patient's response to treatment, examination of patient, obtaining history from patient or surrogate, ordering and performing treatments and interventions, ordering and review of laboratory studies, ordering and review of radiographic studies, pulse oximetry and re-evaluation of patient's condition.  Discussed with critical care who will admit.  Meds given  in ED:  Medications  0.9 %  sodium chloride infusion (0 mLs Intravenous Stopped 2014-02-17 1118)    Followed by  0.9 %  sodium chloride infusion (0 mLs Intravenous Stopped 02-17-2014 1118)    Followed by  0.9 %  sodium chloride infusion (1,000 mLs Intravenous New Bag/Given 2014-02-17 1137)  heparin ADULT infusion 100 units/mL (25000 units/250 mL) (650 Units/hr Intravenous New Bag/Given 2014/02/17 1251)  EPINEPHrine (ADRENALIN) 0.1 MG/ML injection (1 mg Intravenous Given 02-17-2014 1046)  midazolam (VERSED) 5 MG/5ML injection (2 mg Intravenous Given 02/17/2014 1102)  sodium chloride 0.9 % bolus 1,000 mL (0 mLs Intravenous Stopped 2014-02-17 1253)  norepinephrine (LEVOPHED) 4 mg in dextrose 5 % 250 mL (0.016 mg/mL) infusion (20 mcg/min Intravenous New Bag/Given 02/17/2014 1155)  sodium bicarbonate injection 50 mEq (50 mEq Intravenous Given 02-17-14 1133)    New Prescriptions   No medications on file      Vida Roller, MD 02-17-2014 1257

## 2014-02-16 NOTE — ED Notes (Signed)
Troponin results given to Dr. Miller 

## 2014-02-16 NOTE — ED Notes (Signed)
Family at bedside. 

## 2014-02-16 NOTE — Code Documentation (Signed)
Patient arrived via EMS to room and during transport from EMS stretcher to ED bed patient became pulseless. CPR initiated.

## 2014-02-16 NOTE — ED Notes (Signed)
c-collar placed on patient.

## 2014-02-16 NOTE — Progress Notes (Signed)
Patient asystolic in 2 EKG leads; no spontaneous respirations; no apical heart tones; pupils fixed and dilated.  Finding verified with Debby FreibergKatrice  Keller,RN; Glade NurseE Link MD notified.  Patient pronouced at 1830.  Family at bedside.

## 2014-02-16 DEATH — deceased

## 2015-08-22 IMAGING — CT CT CERVICAL SPINE W/O CM
4 of 6 series · 13 of 33 positions shown, 15 images · non-contrast
Comparison: Chest x-ray 02/04/2014.

CLINICAL DATA: 89 yo  found down on her porch

EXAM:
CT HEAD WITHOUT CONTRAST
CT CERVICAL SPINE WITHOUT CONTRAST
TECHNIQUE: Multidetector CT imaging of the head and cervical spine was
performed following the standard protocol without intravenous
contrast. Multiplanar CT image reconstructions of the cervical spine
were also generated.

[Series 7: c_spine 2.0 i40s 3 · axial · 0.30mm/px · z∈[-214,-142]mm · 3 of 73 slices shown, 4 images]
[im 19/73  soft-tissue]
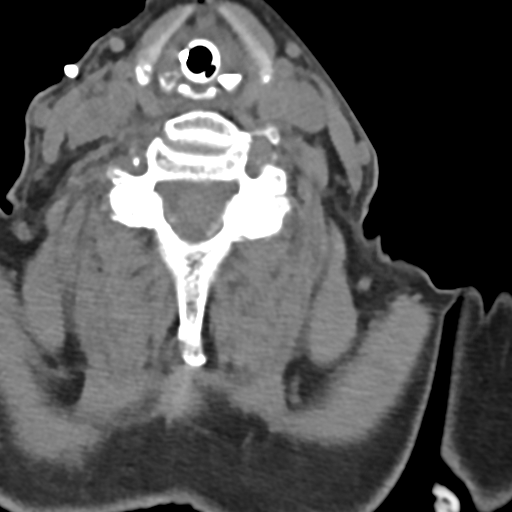
[im 19/73  bone]
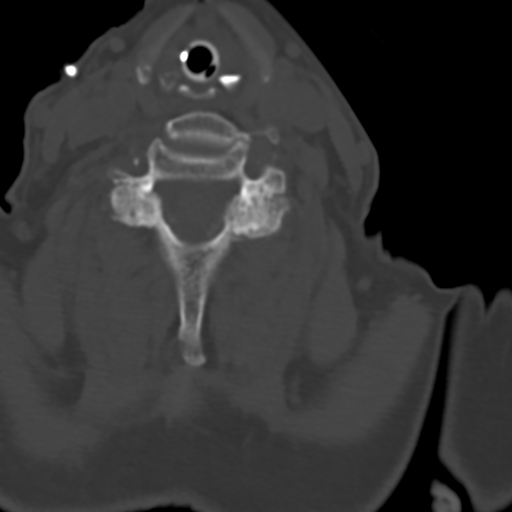
[im 37/73  bone]
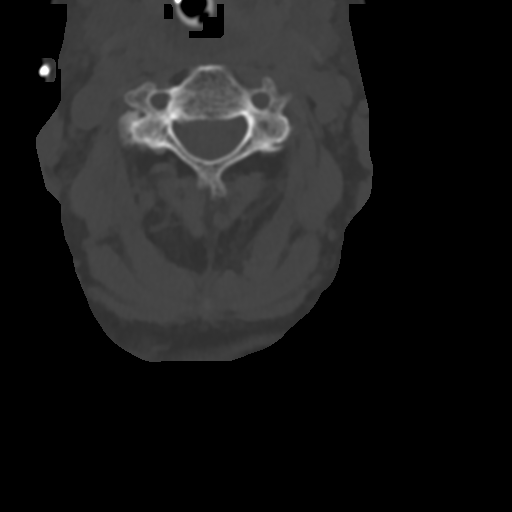
[im 55/73  bone]
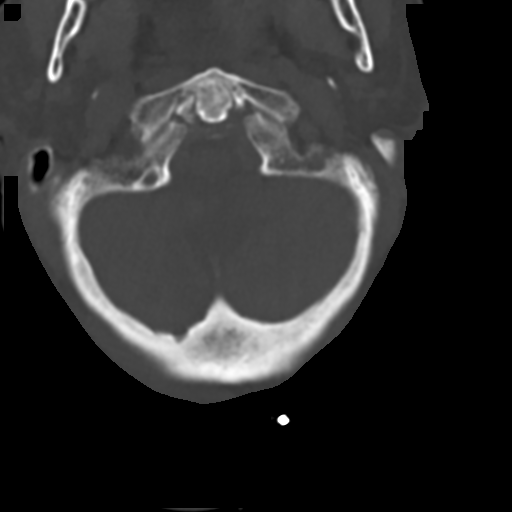

[Series 9: coronals · coronal · 0.18mm/px · 3 of 60 slices shown]
[im 12/60  bone]
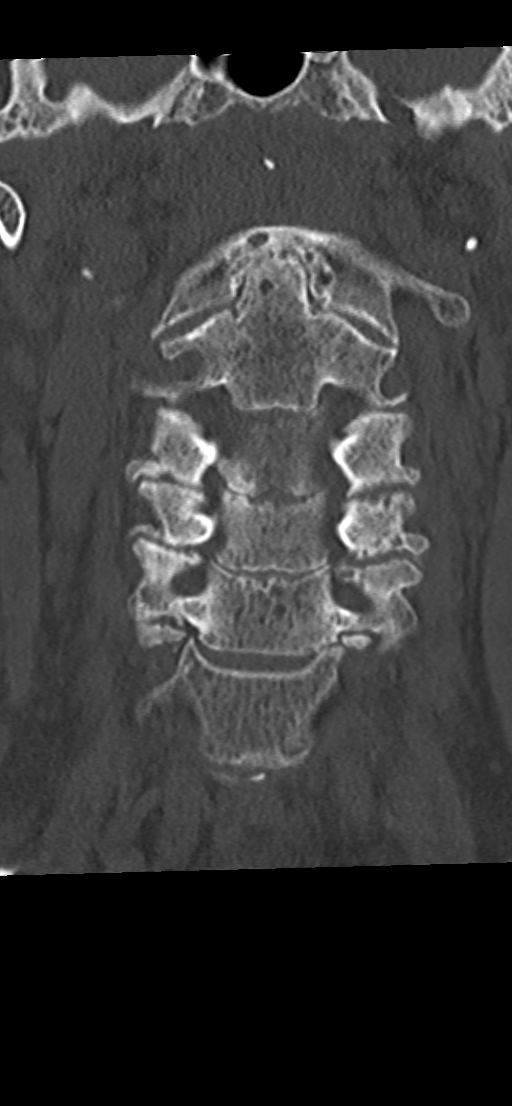
[im 24/60  bone]
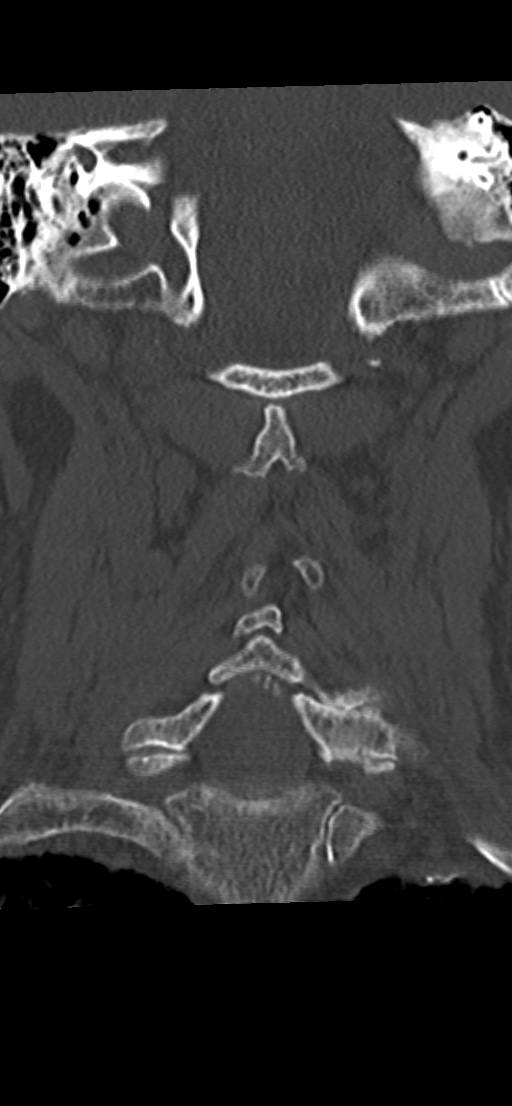
[im 36/60  bone]
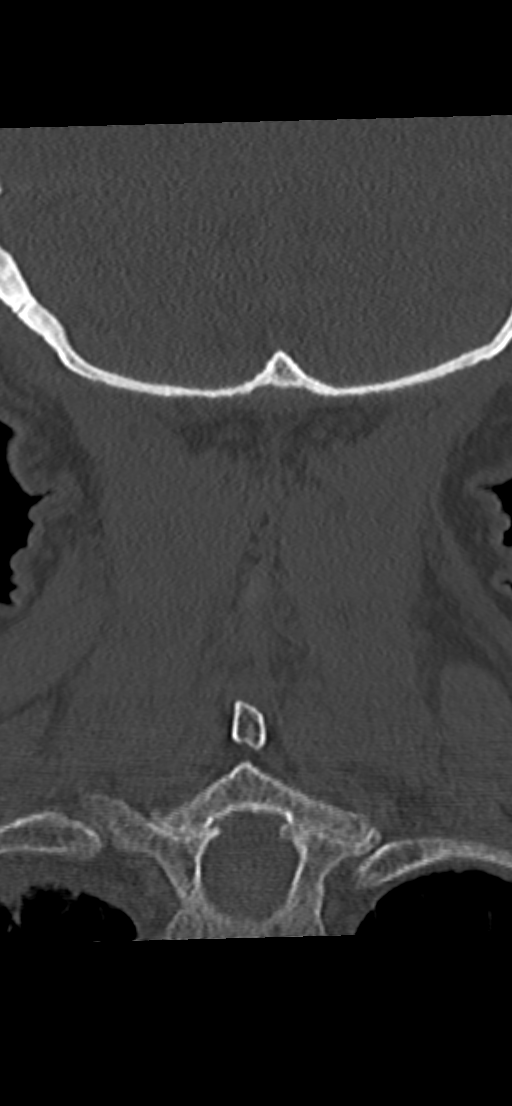

[Series 10: sagittals · sagittal · 0.31mm/px · 5 of 54 slices shown, 6 images]
[im 18/54  bone]
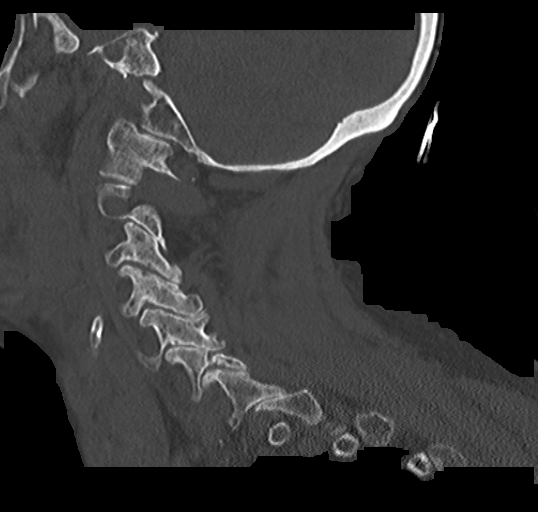
[im 23/54  bone]
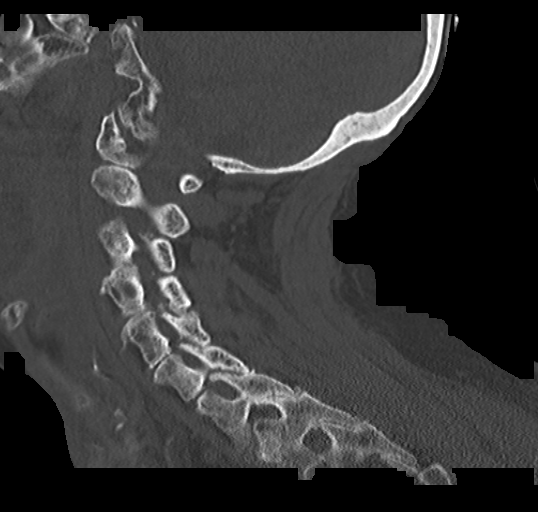
[im 27/54  soft-tissue]
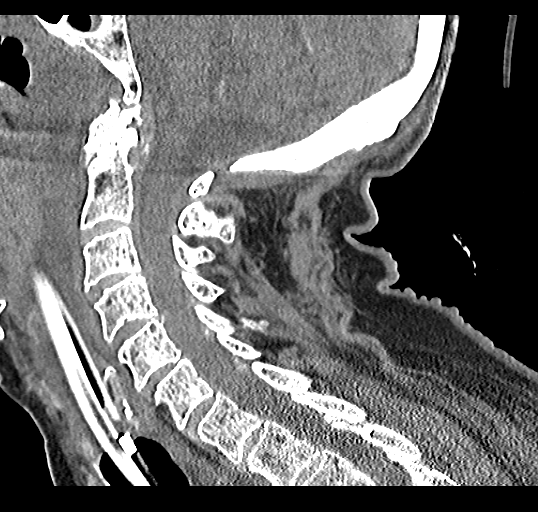
[im 27/54  bone]
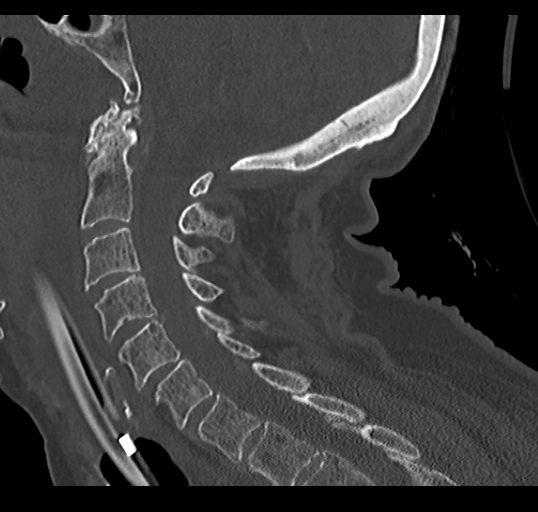
[im 31/54  bone]
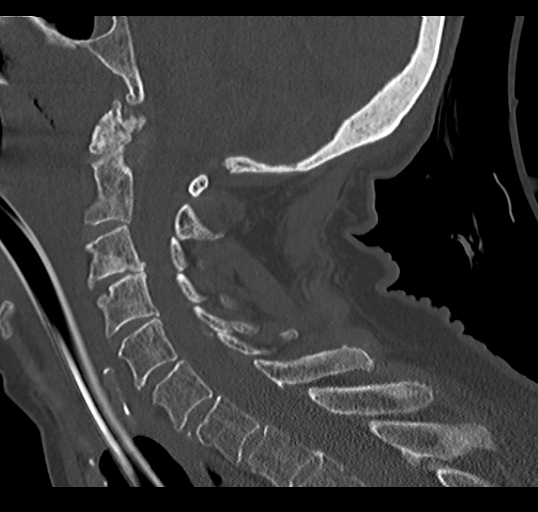
[im 36/54  bone]
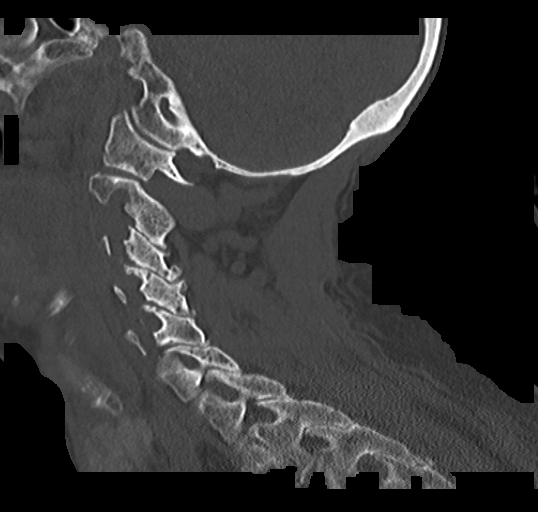

[Series 11: orthogonals · axial · 0.21mm/px · z∈[-242,-203]mm · 2 of 84 slices shown]
[im 21/84  bone]
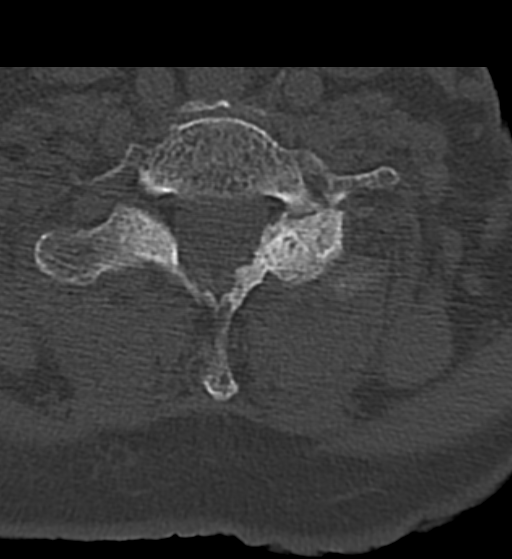
[im 42/84  bone]
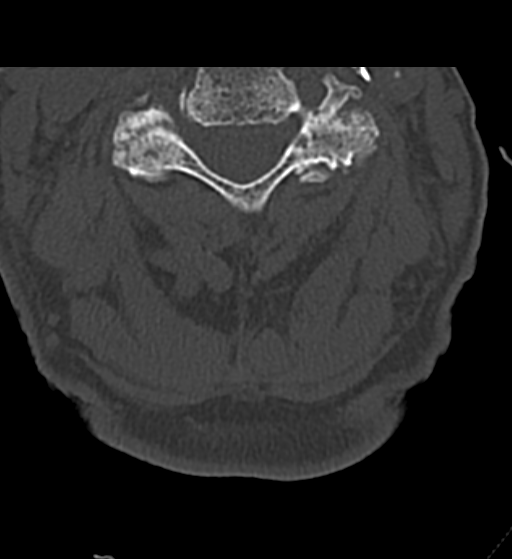

[13 of 33 positions shown; findings below may reference images not displayed]

FINDINGS: CT HEAD FINDINGS

No intracranial hemorrhage. No parenchymal contusion. No midline
shift or mass effect. Basilar cisterns are patent. No skull base
fracture. Orbits are normal.

There is extensive cortical atrophy and ventricular dilatation which
is proportional. Extensive periventricular subcortical white matter
hypodensities.

Mastoid air cells are clear. There is fluid in the maxillary
sinuses. Patient intubated.

CT CERVICAL SPINE FINDINGS

Prevertebral soft tissues are not well evaluated due to intubation.
Normal alignment cervical vertebral bodies. Normal facet
articulation. Normal craniocervical junction. There is degenerative
change at the dens. There is biapical pulmonary scarring.
IMPRESSION: CT head:

1. No evidence of intracranial trauma.
2. Extensive cerebral atrophy and proportional ventricular
dilatation.
3. Extensive chronic white matter microvascular disease.
4. Fluid in the paranasal sinuses.
CT cervical spine:

1. No evidence cervical spine fracture.  Two intubated patient.
2. Multilevel degenerative disc disease. Therefore biapical
pulmonary scarring.
